# Patient Record
Sex: Female | Born: 1950 | Race: White | Hispanic: No | Marital: Married | Smoking: Never smoker
Health system: Southern US, Academic
[De-identification: ages and names within clinical notes are randomized; demographics above are authoritative.]

## PROBLEM LIST (undated history)

## (undated) DIAGNOSIS — K449 Diaphragmatic hernia without obstruction or gangrene: Secondary | ICD-10-CM

## (undated) DIAGNOSIS — R233 Spontaneous ecchymoses: Secondary | ICD-10-CM

## (undated) DIAGNOSIS — K5904 Chronic idiopathic constipation: Secondary | ICD-10-CM

## (undated) DIAGNOSIS — K2270 Barrett's esophagus without dysplasia: Secondary | ICD-10-CM

## (undated) DIAGNOSIS — N951 Menopausal and female climacteric states: Secondary | ICD-10-CM

## (undated) DIAGNOSIS — J45909 Unspecified asthma, uncomplicated: Secondary | ICD-10-CM

## (undated) DIAGNOSIS — K589 Irritable bowel syndrome without diarrhea: Secondary | ICD-10-CM

## (undated) DIAGNOSIS — C801 Malignant (primary) neoplasm, unspecified: Secondary | ICD-10-CM

## (undated) DIAGNOSIS — K219 Gastro-esophageal reflux disease without esophagitis: Secondary | ICD-10-CM

## (undated) DIAGNOSIS — Z973 Presence of spectacles and contact lenses: Secondary | ICD-10-CM

## (undated) DIAGNOSIS — R609 Edema, unspecified: Secondary | ICD-10-CM

## (undated) DIAGNOSIS — M1990 Unspecified osteoarthritis, unspecified site: Secondary | ICD-10-CM

## (undated) DIAGNOSIS — K5900 Constipation, unspecified: Secondary | ICD-10-CM

## (undated) HISTORY — PX: HX HYSTERECTOMY: SHX81

## (undated) HISTORY — DX: Diaphragmatic hernia without obstruction or gangrene: K44.9

## (undated) HISTORY — PX: HX KNEE REPLACMENT: SHX125

## (undated) HISTORY — PX: HX DILATION AND CURETTAGE: SHX78

## (undated) HISTORY — DX: Menopausal and female climacteric states: N95.1

## (undated) HISTORY — DX: Barrett's esophagus without dysplasia: K22.70

## (undated) HISTORY — PX: HX UPPER ENDOSCOPY: 2100001144

## (undated) HISTORY — PX: FACELIFT: SHX1566

## (undated) HISTORY — DX: Gastro-esophageal reflux disease without esophagitis: K21.9

## (undated) HISTORY — DX: Irritable bowel syndrome, unspecified: K58.9

## (undated) HISTORY — PX: COLONOSCOPY: SHX174

## (undated) HISTORY — PX: HX HIP REPLACEMENT: SHX124

## (undated) HISTORY — DX: Unspecified asthma, uncomplicated: J45.909

## (undated) HISTORY — DX: Edema, unspecified: R60.9

## (undated) HISTORY — DX: Chronic idiopathic constipation: K59.04

---

## 1985-04-09 ENCOUNTER — Other Ambulatory Visit (HOSPITAL_COMMUNITY): Payer: Self-pay

## 2015-12-18 ENCOUNTER — Ambulatory Visit (INDEPENDENT_AMBULATORY_CARE_PROVIDER_SITE_OTHER): Payer: Self-pay | Admitting: Rheumatology

## 2015-12-25 ENCOUNTER — Ambulatory Visit (INDEPENDENT_AMBULATORY_CARE_PROVIDER_SITE_OTHER): Payer: No Typology Code available for payment source | Admitting: Rheumatology

## 2016-02-02 ENCOUNTER — Ambulatory Visit (INDEPENDENT_AMBULATORY_CARE_PROVIDER_SITE_OTHER): Payer: No Typology Code available for payment source | Admitting: Rheumatology

## 2016-04-22 ENCOUNTER — Ambulatory Visit (INDEPENDENT_AMBULATORY_CARE_PROVIDER_SITE_OTHER): Payer: No Typology Code available for payment source | Admitting: Rheumatology

## 2016-04-22 ENCOUNTER — Encounter (INDEPENDENT_AMBULATORY_CARE_PROVIDER_SITE_OTHER): Payer: Self-pay

## 2016-07-03 ENCOUNTER — Ambulatory Visit (INDEPENDENT_AMBULATORY_CARE_PROVIDER_SITE_OTHER): Payer: No Typology Code available for payment source | Admitting: Rheumatology

## 2016-07-03 ENCOUNTER — Ambulatory Visit: Payer: No Typology Code available for payment source | Attending: Rheumatology | Admitting: Rheumatology

## 2016-07-03 VITALS — BP 100/54 | HR 65 | Ht 63.0 in | Wt 106.7 lb

## 2016-07-03 DIAGNOSIS — M19049 Primary osteoarthritis, unspecified hand: Secondary | ICD-10-CM | POA: Insufficient documentation

## 2016-07-03 DIAGNOSIS — E559 Vitamin D deficiency, unspecified: Secondary | ICD-10-CM

## 2016-07-03 DIAGNOSIS — M810 Age-related osteoporosis without current pathological fracture: Secondary | ICD-10-CM | POA: Insufficient documentation

## 2016-07-03 DIAGNOSIS — Z8781 Personal history of (healed) traumatic fracture: Secondary | ICD-10-CM | POA: Insufficient documentation

## 2016-07-03 DIAGNOSIS — K219 Gastro-esophageal reflux disease without esophagitis: Secondary | ICD-10-CM | POA: Insufficient documentation

## 2016-07-03 DIAGNOSIS — R682 Dry mouth, unspecified: Secondary | ICD-10-CM | POA: Insufficient documentation

## 2016-07-03 DIAGNOSIS — H04123 Dry eye syndrome of bilateral lacrimal glands: Secondary | ICD-10-CM | POA: Insufficient documentation

## 2016-07-03 DIAGNOSIS — Z96641 Presence of right artificial hip joint: Secondary | ICD-10-CM | POA: Insufficient documentation

## 2016-07-03 DIAGNOSIS — M8580 Other specified disorders of bone density and structure, unspecified site: Secondary | ICD-10-CM | POA: Insufficient documentation

## 2016-07-03 DIAGNOSIS — Z79899 Other long term (current) drug therapy: Secondary | ICD-10-CM | POA: Insufficient documentation

## 2016-07-03 DIAGNOSIS — K089 Disorder of teeth and supporting structures, unspecified: Secondary | ICD-10-CM | POA: Insufficient documentation

## 2016-07-03 LAB — CREATININE WITH EGFR
CREATININE: 0.79 mg/dL (ref 0.49–1.10)
ESTIMATED GFR: >59 mL/min/1.73m?2 (ref >59)

## 2016-07-03 LAB — CBC WITH DIFF
BASOPHIL #: 0.08 x10?3/uL (ref 0.00–0.20)
BASOPHIL %: 1 %
EOSINOPHIL #: 0.21 x10ˆ3/uL (ref 0.00–0.50)
EOSINOPHIL %: 3 %
HCT: 35.3 % (ref 33.5–45.2)
HGB: 11.3 g/dL (ref 11.2–15.2)
LYMPHOCYTE #: 1.64 x10?3/uL (ref 1.00–4.80)
LYMPHOCYTE %: 27 %
MCH: 26.3 pg — ABNORMAL LOW (ref 27.4–33.0)
MCHC: 32.1 g/dL — ABNORMAL LOW (ref 32.5–35.8)
MCV: 82 fL (ref 78.0–100.0)
MONOCYTE #: 0.63 x10ˆ3/uL (ref 0.30–1.00)
MONOCYTE %: 10 %
MPV: 8.1 fL (ref 7.5–11.5)
NEUTROPHIL #: 3.63 x10ˆ3/uL (ref 1.50–7.70)
NEUTROPHIL %: 59 %
PLATELETS: 371 x10?3/uL (ref 140–450)
RBC: 4.31 x10?6/uL (ref 3.63–4.92)
RDW: 16.3 % — ABNORMAL HIGH (ref 12.0–15.0)
WBC: 6.2 x10ˆ3/uL (ref 3.5–11.0)

## 2016-07-03 LAB — CALCIUM: CALCIUM: 9 mg/dL (ref 8.5–10.2)

## 2016-07-03 LAB — CREATININE: CREATININE: 0.79 mg/dL (ref 0.49–1.10)

## 2016-07-03 LAB — ALK PHOS (ALKALINE PHOSPHATASE): ALKALINE PHOSPHATASE: 71 U/L (ref <150)

## 2016-07-04 LAB — VITAMIN D 25, TOTAL: VITAMIN D, 25OH: 57 ng/mL (ref 30–100)
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
## 2016-07-04 NOTE — Progress Notes (Signed)
Your blood work looks good. No changes are needed at this time.

## 2016-12-03 ENCOUNTER — Encounter (INDEPENDENT_AMBULATORY_CARE_PROVIDER_SITE_OTHER): Payer: No Typology Code available for payment source | Admitting: Rheumatology

## 2017-02-25 ENCOUNTER — Encounter (INDEPENDENT_AMBULATORY_CARE_PROVIDER_SITE_OTHER): Payer: No Typology Code available for payment source | Admitting: Rheumatology

## 2017-09-02 ENCOUNTER — Encounter (INDEPENDENT_AMBULATORY_CARE_PROVIDER_SITE_OTHER): Payer: Self-pay | Admitting: Rheumatology

## 2018-08-28 IMAGING — CR XRAY KNEE 3 VIEWS LT
1 series · 3 of 3 positions shown · non-contrast
Comparison: None available.

EXAM:  XRAY KNEE 3 VIEWS LT
INDICATION: Pain.

[Series 1: view not recorded · 0.17mm/px · 3 of 3 slices shown]
[im 1/3]
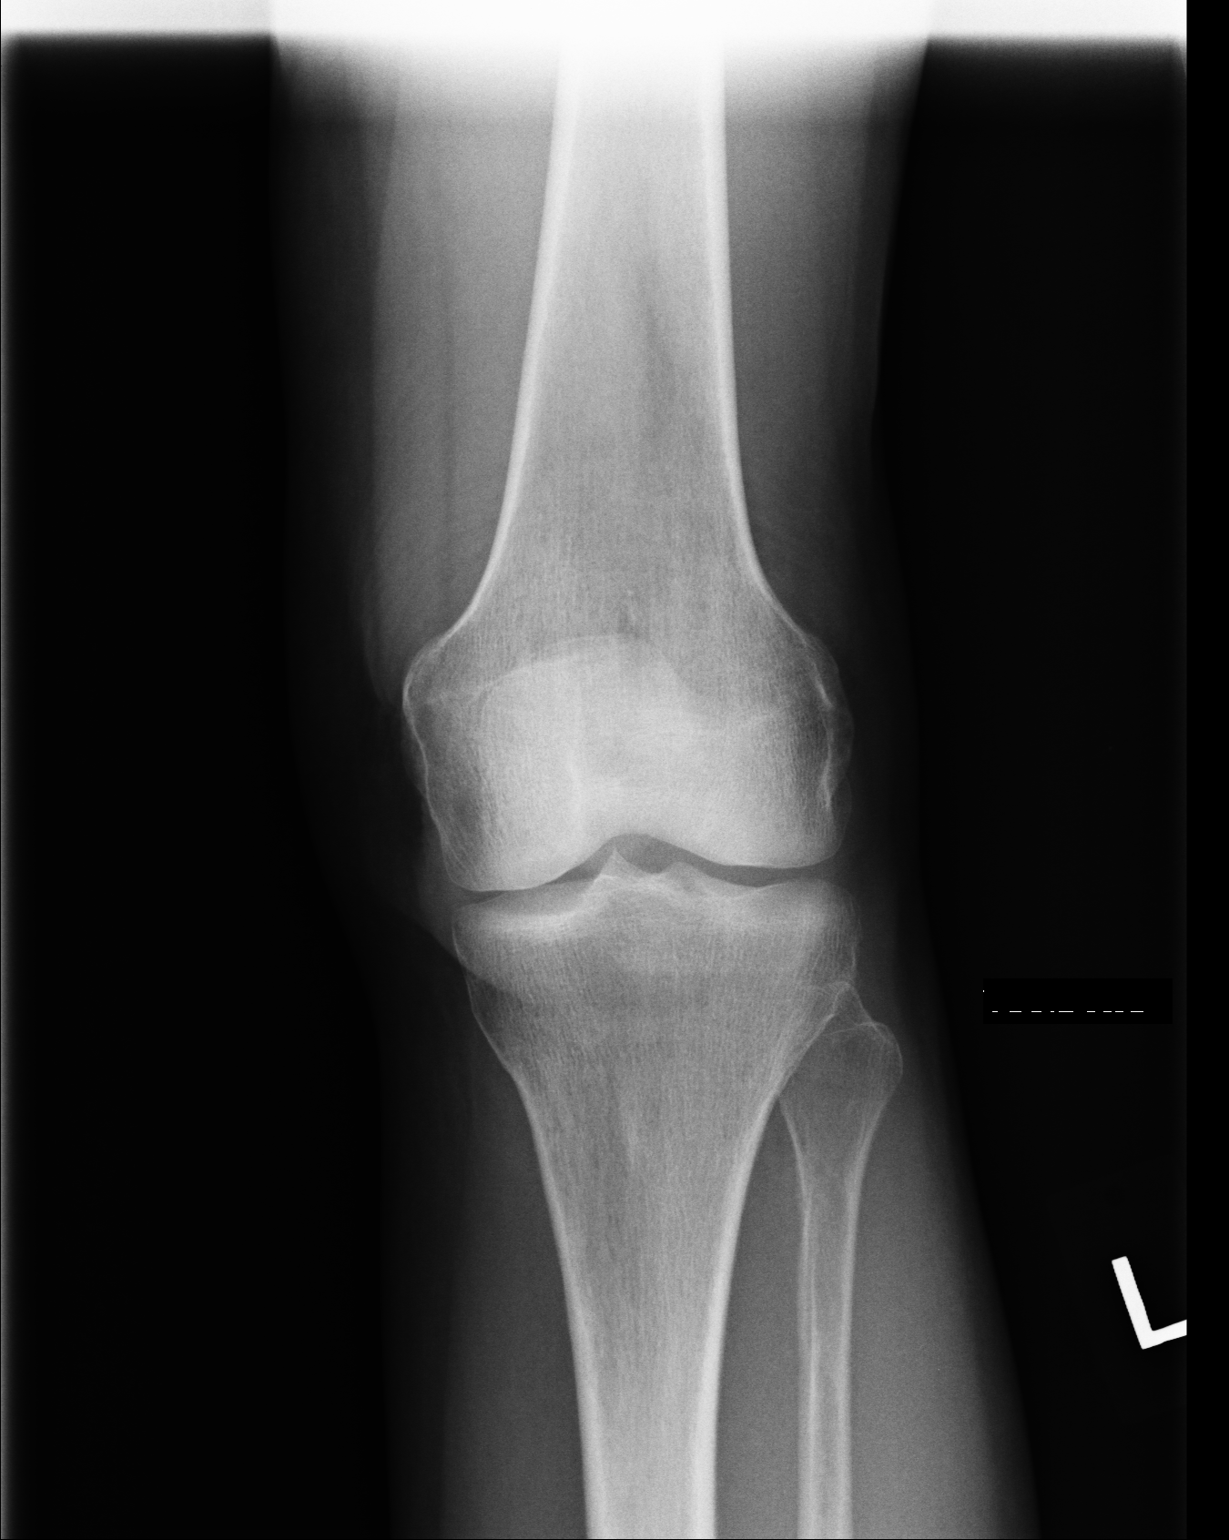
[im 2/3]
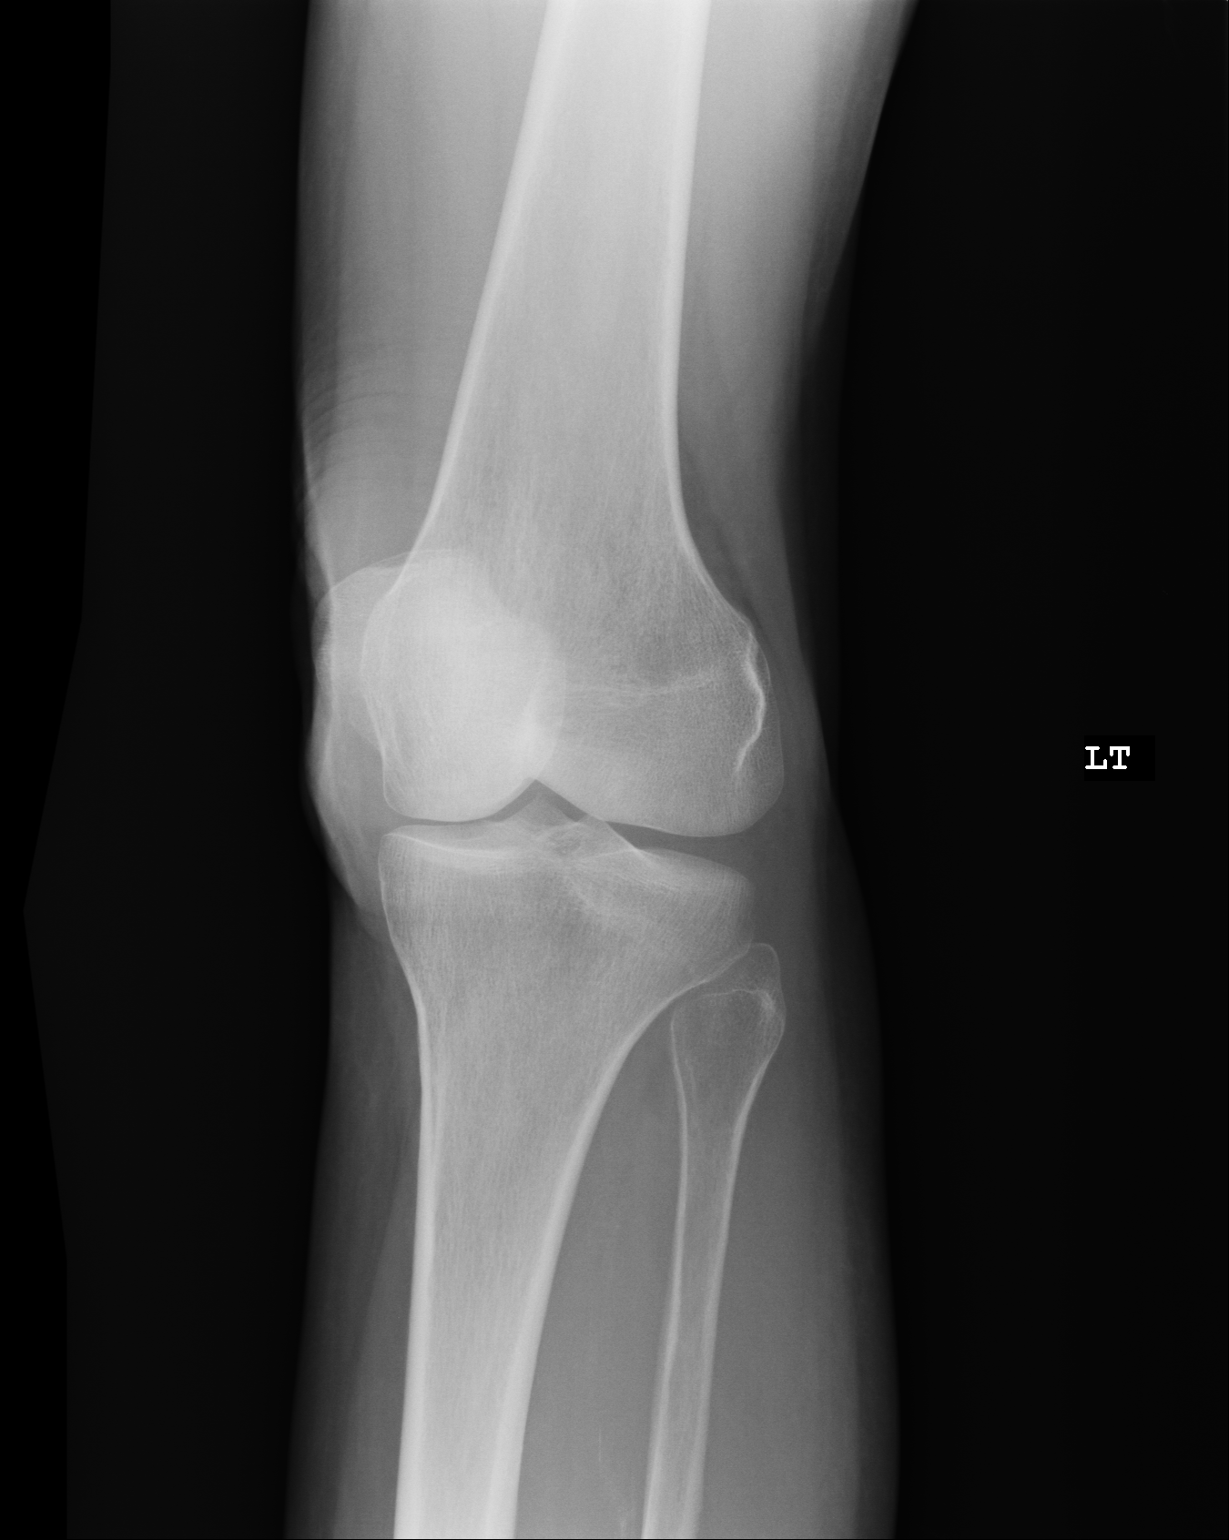
[im 3/3]
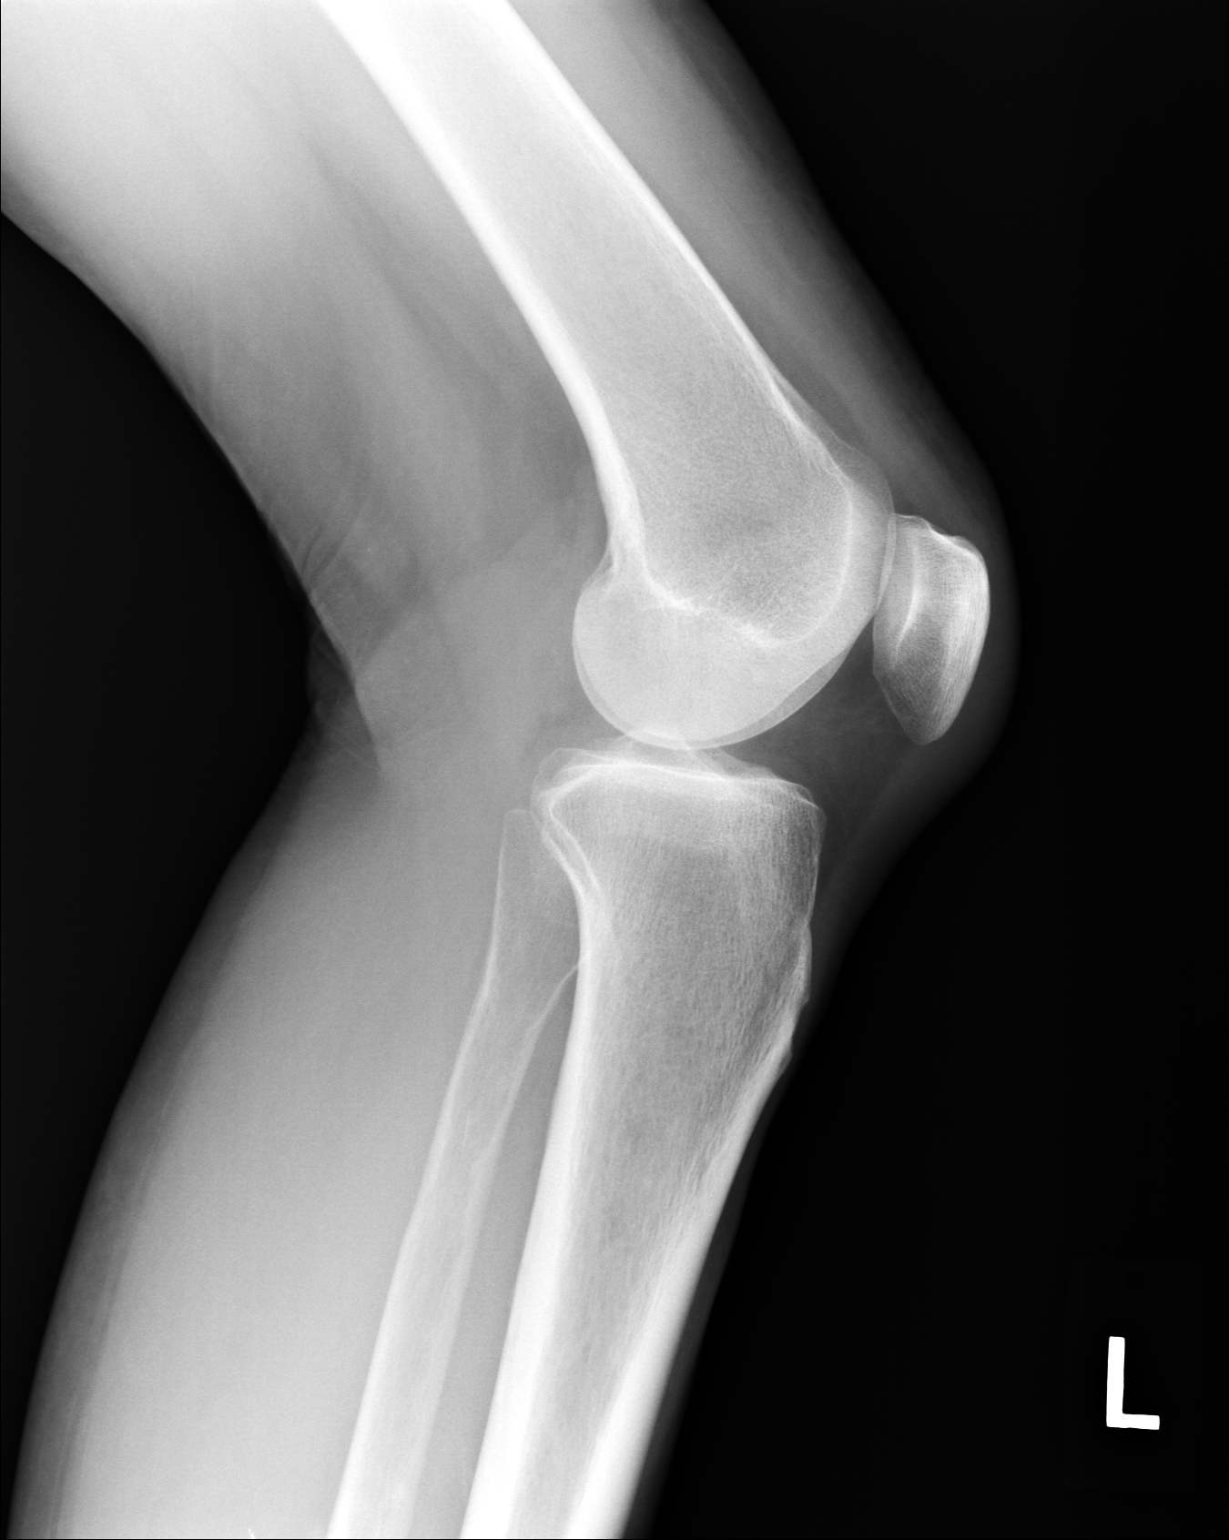

[3 of 3 positions shown; findings below may reference images not displayed]

FINDINGS: There is no acute fracture or subluxation. No significant arthritic changes are seen. There is no suprapatellar effusion. There is no soft tissue abnormality.
IMPRESSION: Unremarkable exam.

## 2020-01-11 IMAGING — MR MRI JOINT LOWER EXTREMITY WITHOUT CONTRAST LT
5 series · 40 of 40 positions shown · IV contrast (gadolinium)
Comparison: Radiographs from outside facility dated 12/01/2019.

﻿EXAM:  MRI JOINT LOWER EXTREMITY WITHOUT CONTRAST LT
INDICATION: Left knee pain and swelling.
TECHNIQUE: Multiplanar multisequential MRI of the left knee joint was performed without gadolinium contrast.

[Series 5: PD fat-sat · axial · left · 4.0mm · 0.53mm/px · z∈[-73,+57]mm · 8 of 30 slices shown (1 of 3)]
[im 1/30]
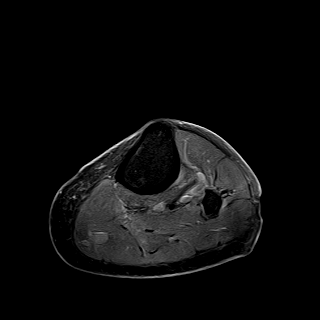
[im 5/30]
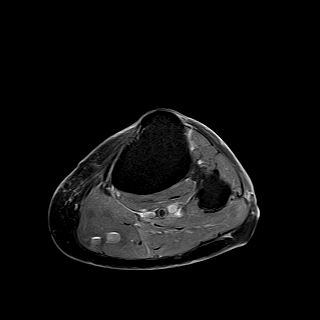
[im 9/30]
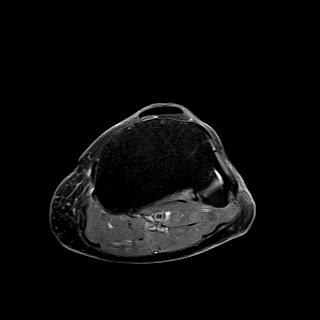
[im 13/30]
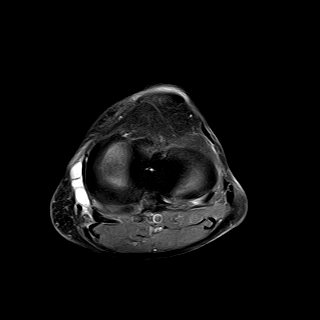
[im 17/30]
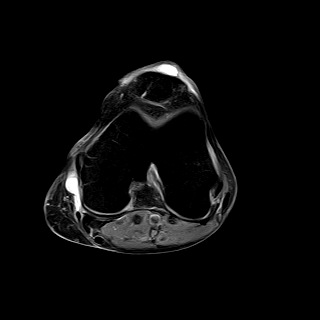
[im 21/30]
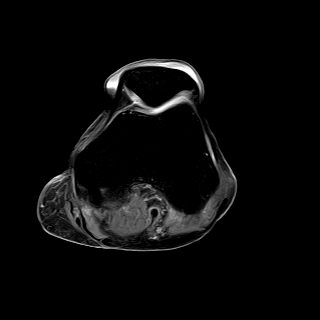
[im 25/30]
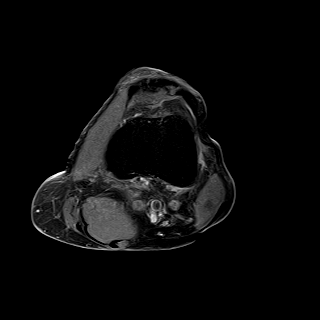
[im 30/30]
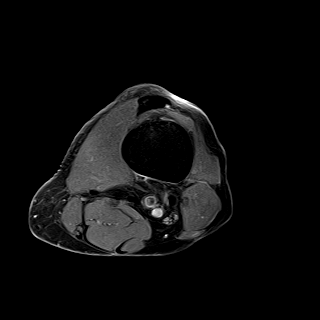

[Series 6: PD fat-sat · sagittal · left · 3.0mm · 0.47mm/px · 8 of 30 slices shown (2 of 3)]
[im 1/30]
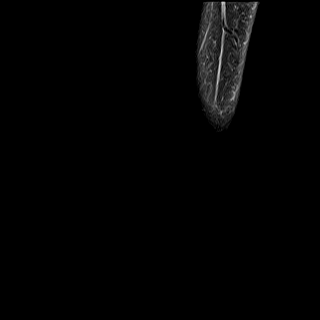
[im 5/30]
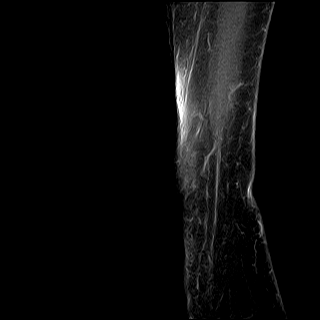
[im 9/30]
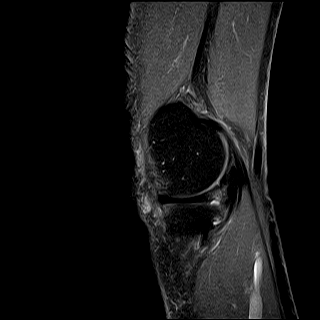
[im 13/30]
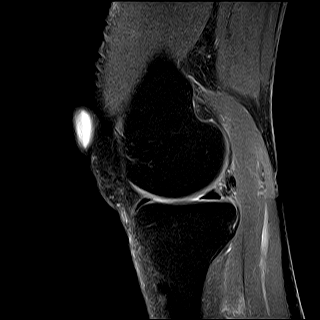
[im 17/30]
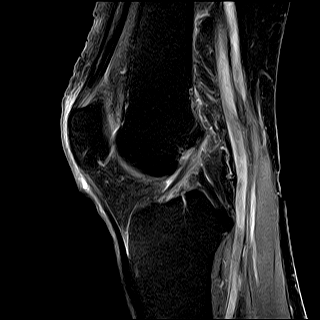
[im 21/30]
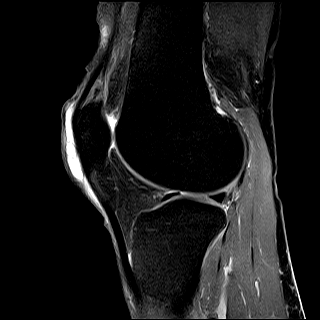
[im 25/30]
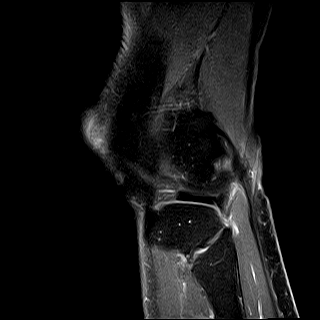
[im 30/30]
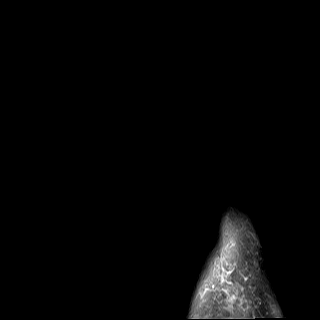

[Series 7: STIR · coronal · left · 3.0mm · 0.47mm/px · 8 of 27 slices shown]
[im 1/27]
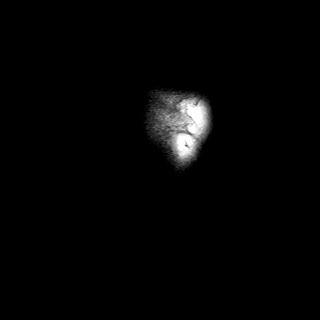
[im 4/27]
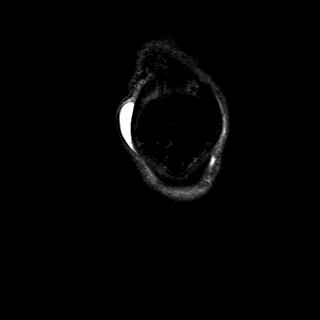
[im 8/27]
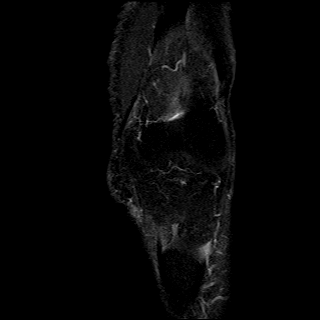
[im 12/27]
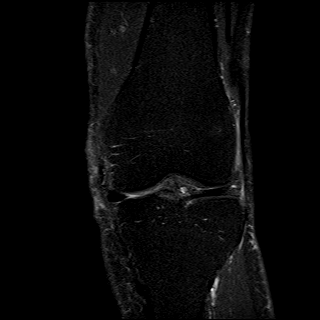
[im 15/27]
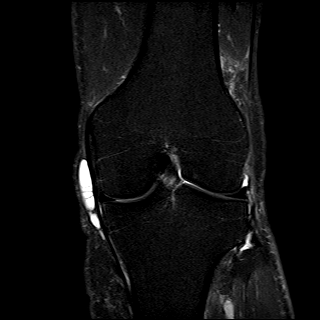
[im 19/27]
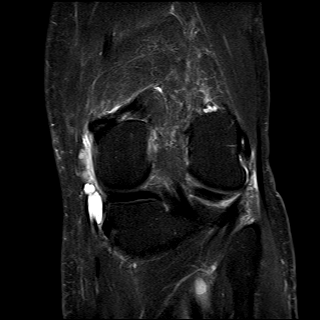
[im 23/27]
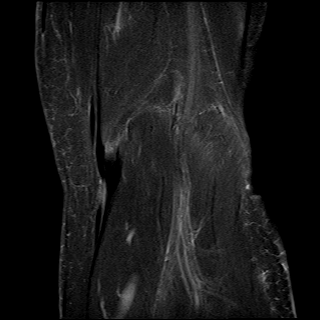
[im 27/27]
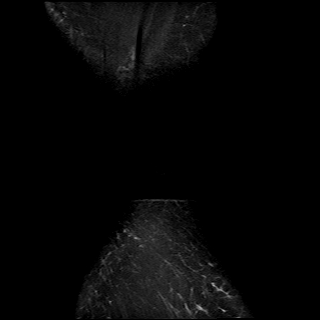

[Series 8: T1 · sagittal · left · 3.0mm · 0.39mm/px · 8 of 30 slices shown]
[im 1/30]
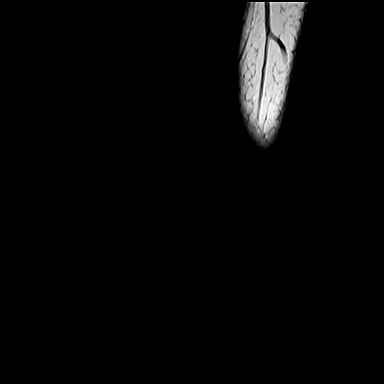
[im 5/30]
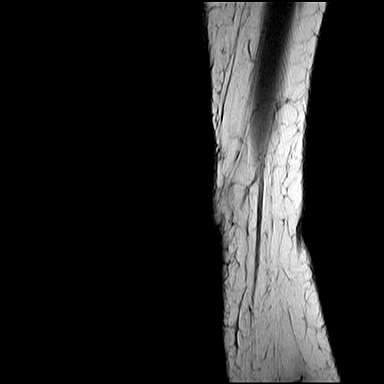
[im 9/30]
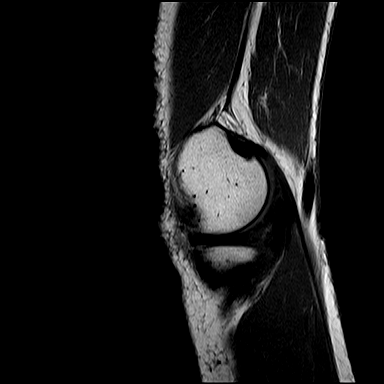
[im 13/30]
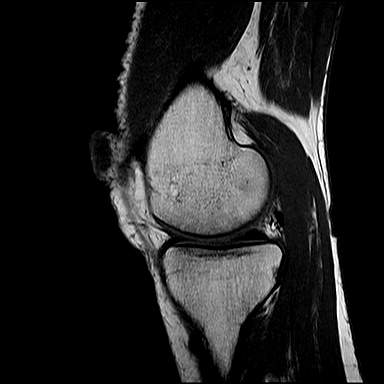
[im 17/30]
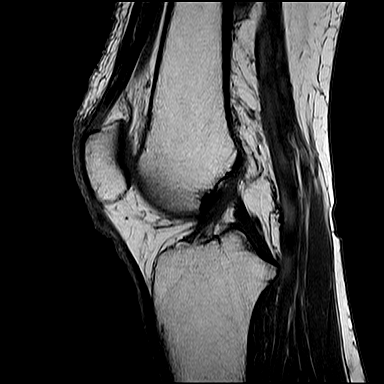
[im 21/30]
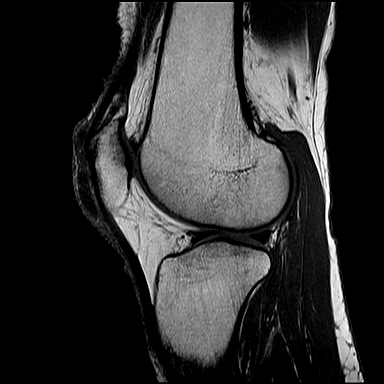
[im 25/30]
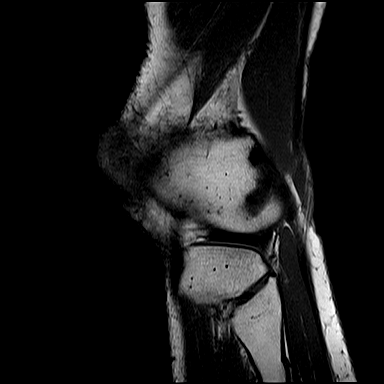
[im 30/30]
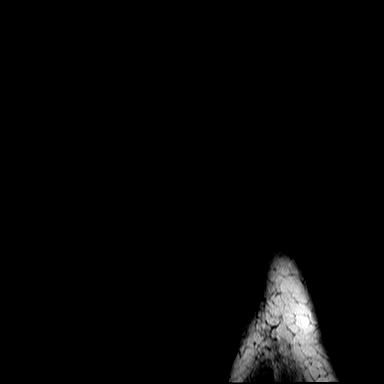

[Series 9: PD fat-sat · coronal · left · 3.0mm · 0.47mm/px · 8 of 27 slices shown (3 of 3)]
[im 1/27]
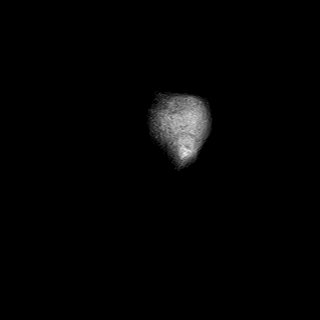
[im 4/27]
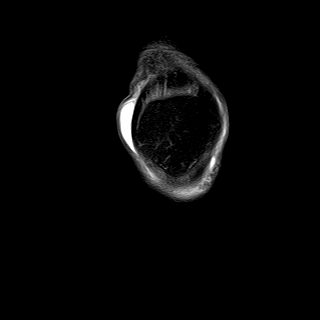
[im 8/27]
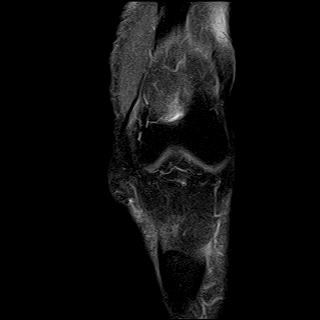
[im 12/27]
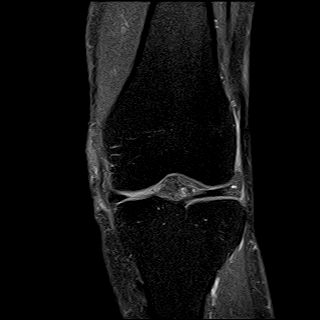
[im 15/27]
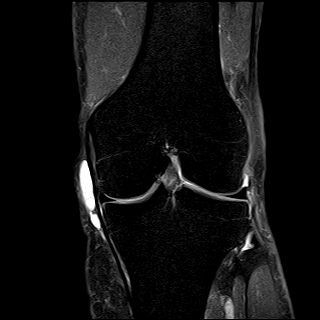
[im 19/27]
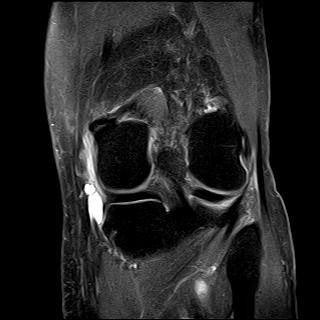
[im 23/27]
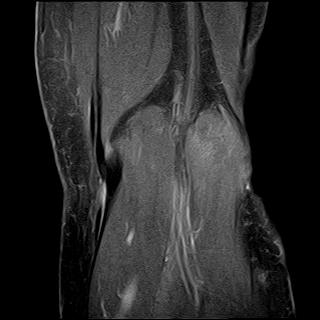
[im 27/27]
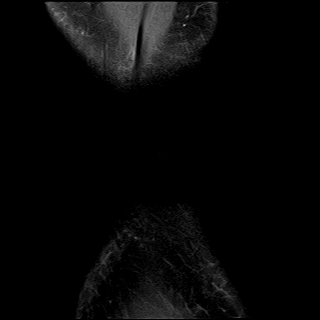

[40 of 40 positions shown; findings below may reference images not displayed]

FINDINGS: Menisci, cruciate and collateral ligaments are intact, within normal limits in morphology and signal intensity. Hyaline cartilage of the tibiofemoral and patellofemoral articulations is also well maintained. There is a 3.8 x 0.8 cm septated cyst along the medial collateral ligament. There is also mild prepatellar bursitis. Extensor mechanism is intact. Capsular attachments appear unremarkable. Bone marrow signal intensity is normal. There is no suprapatellar effusion or Baker's cyst.
IMPRESSION: 1. Intact menisci, cruciate and collateral ligaments. 

2. A 3.8 cm septated cyst along the medial collateral ligament and mild prepatellar bursitis.

## 2020-06-21 IMAGING — MR MRI KNEE RT W/O CONTRAST
5 series · 40 of 40 positions shown · IV contrast (gadolinium)
Comparison: Radiographs from outside facility dated 12/01/2019.

﻿EXAM:  [id number]   MRI KNEE RT W/O CONTRAST
INDICATION: Right knee pain for 3 weeks.
TECHNIQUE: Multiplanar, multisequential MRI of the right knee joint was performed without gadolinium contrast.

[Series 5: PD fat-sat · axial · right · 4.0mm · 0.53mm/px · z∈[-105,+26]mm · 8 of 30 slices shown (1 of 3)]
[im 1/30]
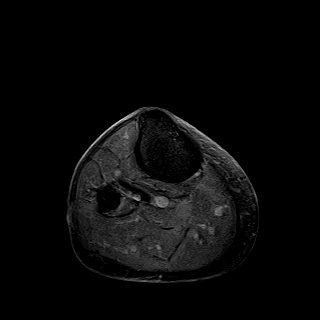
[im 5/30]
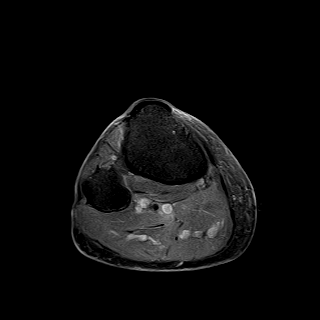
[im 9/30]
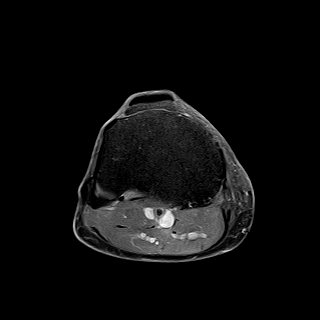
[im 13/30]
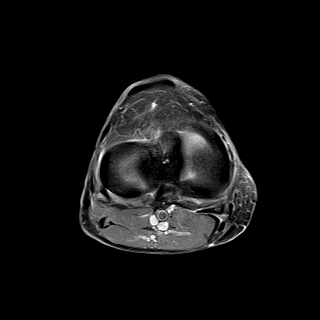
[im 17/30]
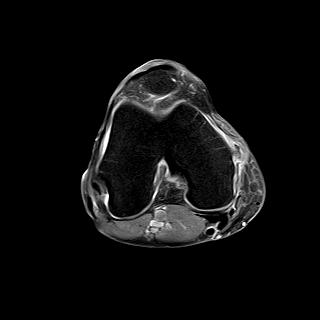
[im 21/30]
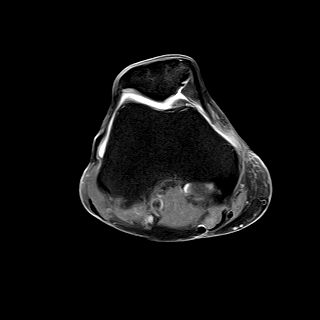
[im 25/30]
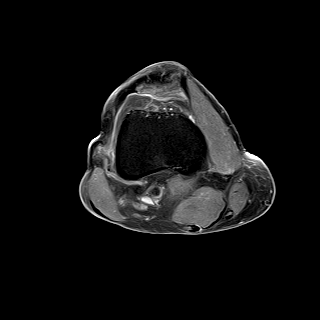
[im 30/30]
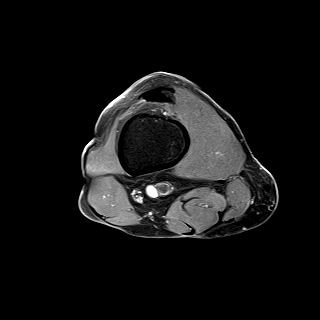

[Series 6: PD fat-sat · sagittal · right · 3.0mm · 0.47mm/px · 9 of 30 slices shown (2 of 3)]
[im 1/30]
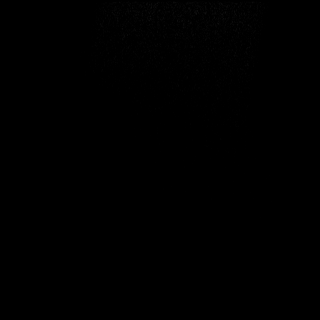
[im 4/30]
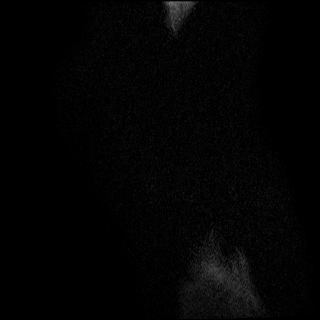
[im 8/30]
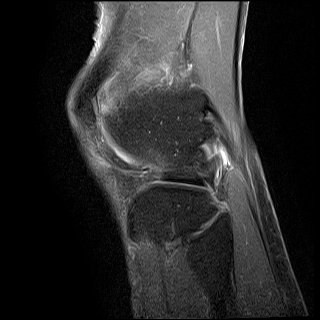
[im 11/30]
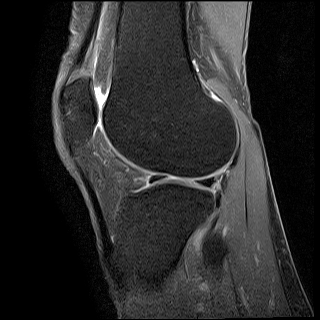
[im 15/30]
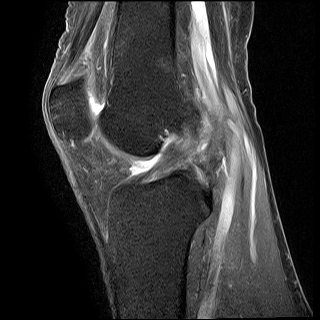
[im 19/30]
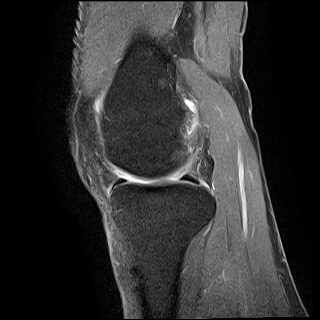
[im 22/30]
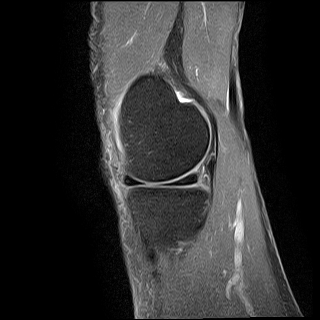
[im 26/30]
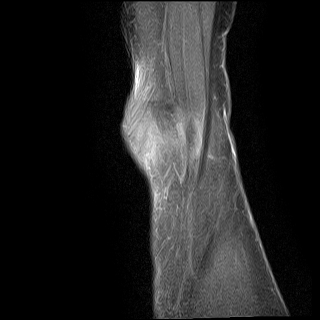
[im 30/30]
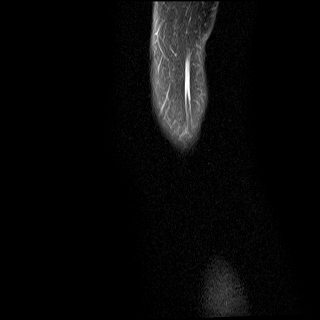

[Series 7: T1 · sagittal · right · 3.0mm · 0.39mm/px · 9 of 30 slices shown]
[im 1/30]
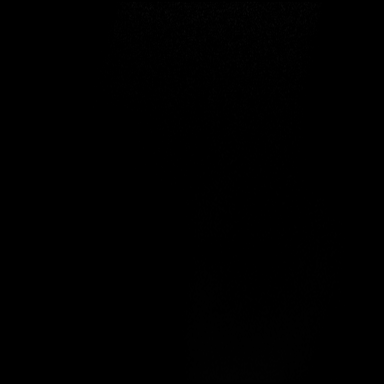
[im 4/30]
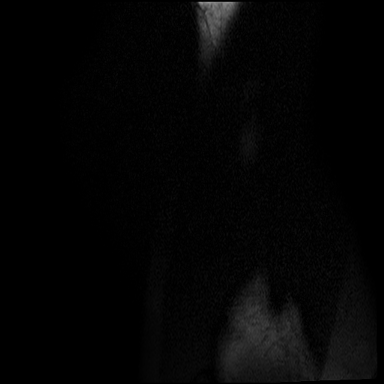
[im 8/30]
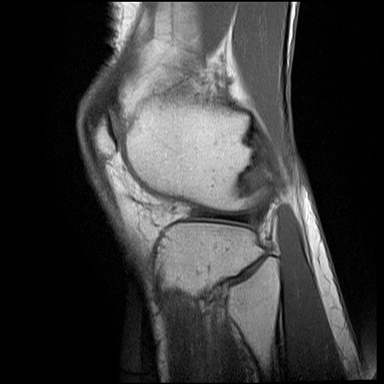
[im 11/30]
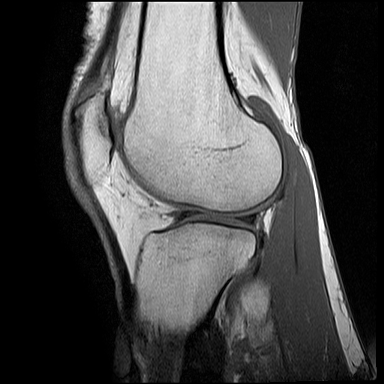
[im 15/30]
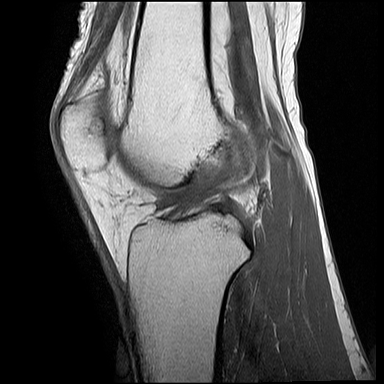
[im 19/30]
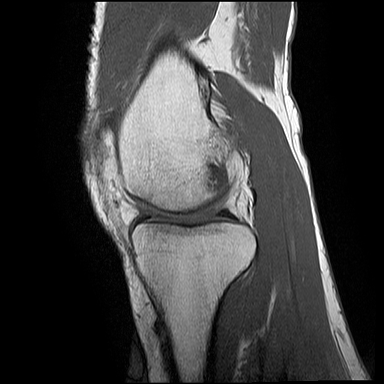
[im 22/30]
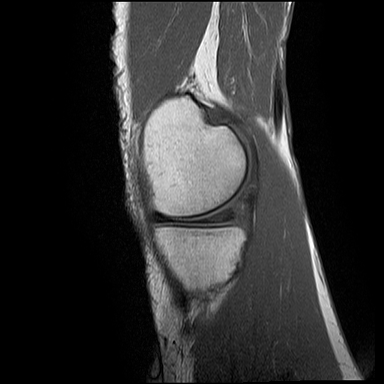
[im 26/30]
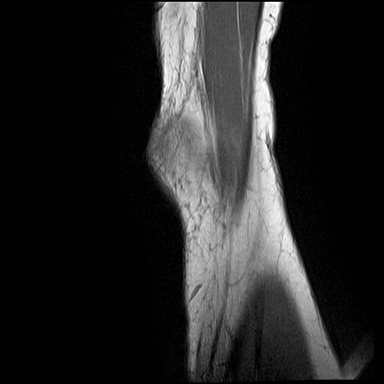
[im 30/30]
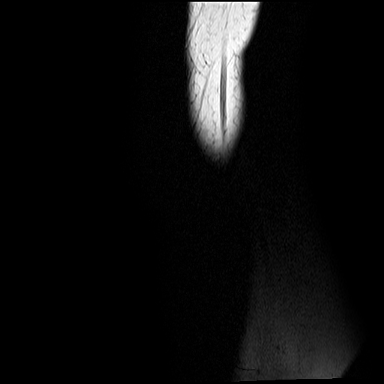

[Series 8: STIR · coronal · right · 3.4mm · 0.47mm/px · 7 of 22 slices shown]
[im 1/22]
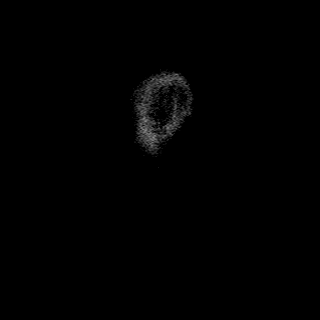
[im 4/22]
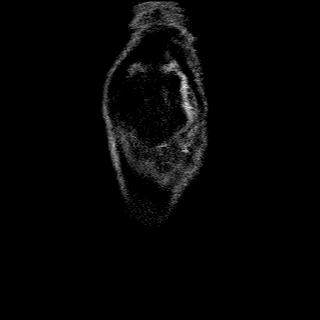
[im 8/22]
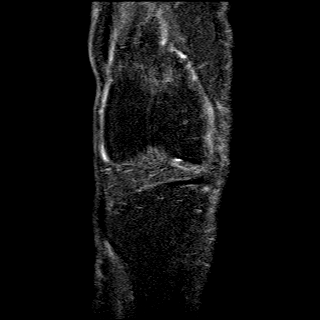
[im 11/22]
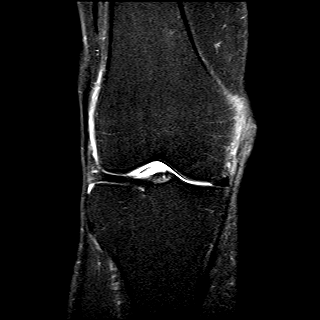
[im 15/22]
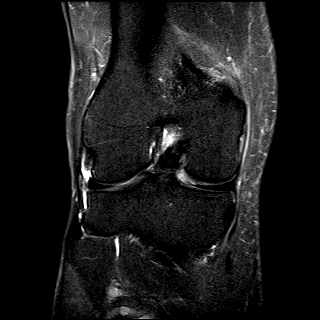
[im 18/22]
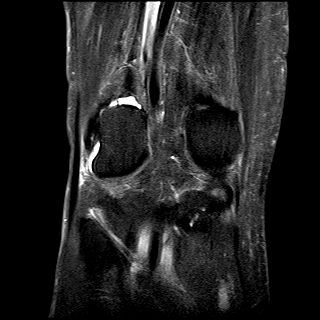
[im 22/22]
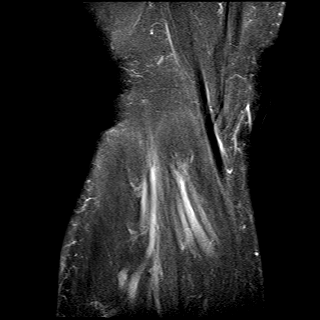

[Series 9: PD fat-sat · coronal · right · 3.4mm · 0.47mm/px · 7 of 22 slices shown (3 of 3)]
[im 1/22]
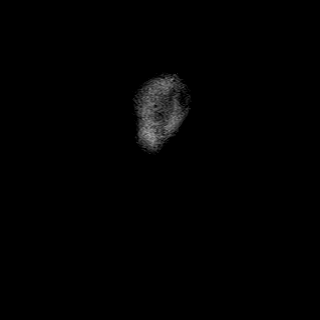
[im 4/22]
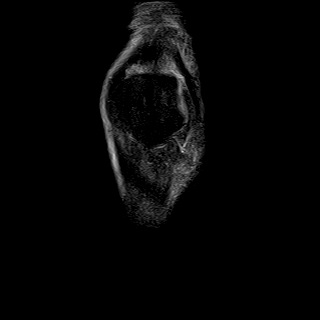
[im 8/22]
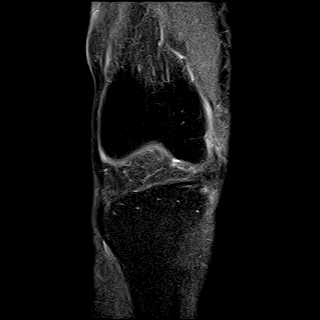
[im 11/22]
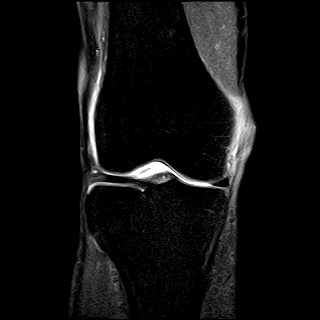
[im 15/22]
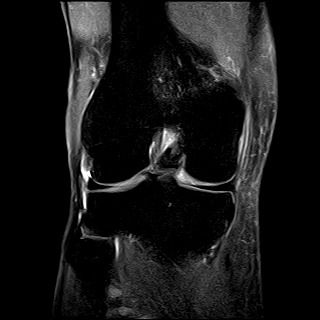
[im 18/22]
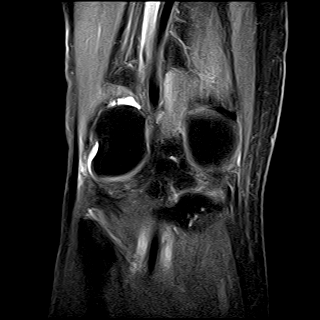
[im 22/22]
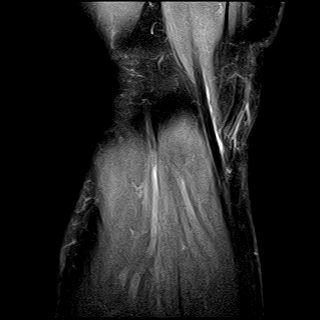

[40 of 40 positions shown; findings below may reference images not displayed]

FINDINGS: There is grade 1 sprain of the proximal medial collateral ligament.  Menisci, cruciate and lateral collateral ligament complex are intact, within normal limits in morphology and signal intensity.  Hyaline cartilage of the tibiofemoral and patellofemoral articulations is well maintained.  Extensor mechanism is intact.  Capsular attachments appear unremarkable.  Bone marrow signal intensity is normal.  There is no suprapatellar effusion or Baker's cyst.
IMPRESSION: 1. Grade 1 sprain of the proximal medial collateral ligament.  

2. Intact menisci, cruciate and lateral collateral ligaments.

## 2020-11-14 IMAGING — MR MRI KNEE LEFT W/O CONTRAST
6 series · 40 of 40 positions shown · IV contrast (gadolinium)
Comparison: Radiographs dated 08/28/2018.

﻿EXAM:  [id number]   MRI KNEE LEFT W/O CONTRAST
INDICATION: Pain.
TECHNIQUE: Multiplanar multisequential MRI of the left knee joint was performed without gadolinium contrast.

[Series 4: s-map · axial · right · 7.8mm · 3.91mm/px · z∈[-152,+152]mm · 14 of 79 slices shown]
[im 1/79]
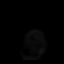
[im 7/79]
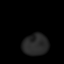
[im 13/79]
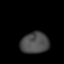
[im 19/79]
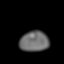
[im 25/79]
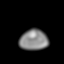
[im 31/79]
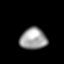
[im 37/79]
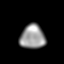
[im 43/79]
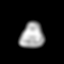
[im 49/79]
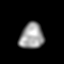
[im 55/79]
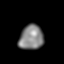
[im 61/79]
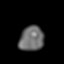
[im 67/79]
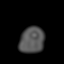
[im 73/79]
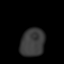
[im 79/79]
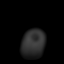

[Series 5: PD fat-sat · axial · left · 4.0mm · 0.37mm/px · z∈[-76,+55]mm · 6 of 30 slices shown (1 of 3)]
[im 1/30]
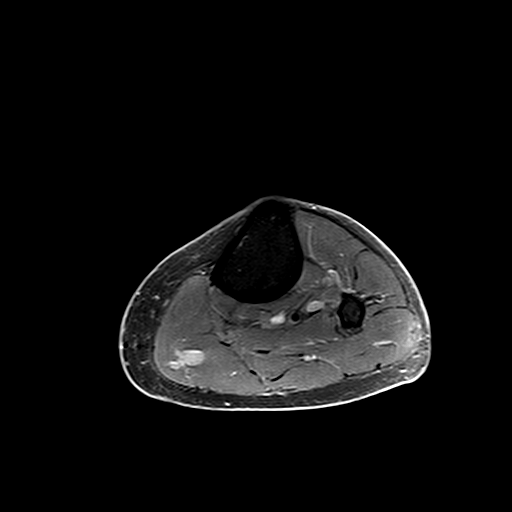
[im 6/30]
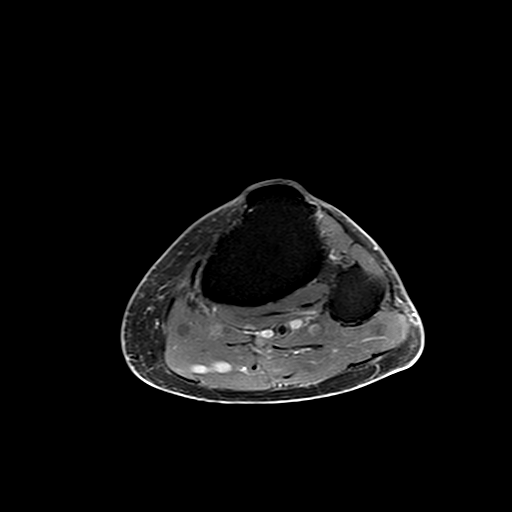
[im 12/30]
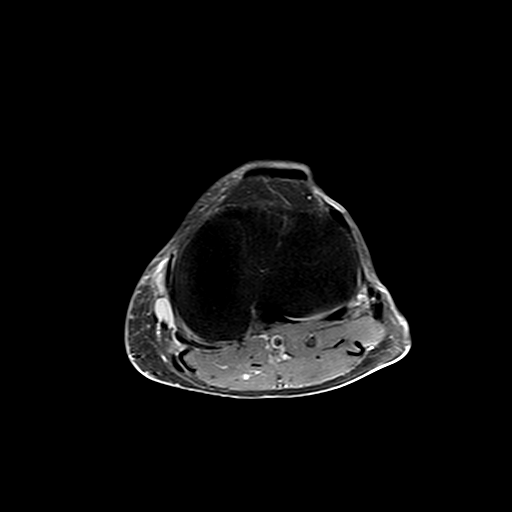
[im 18/30]
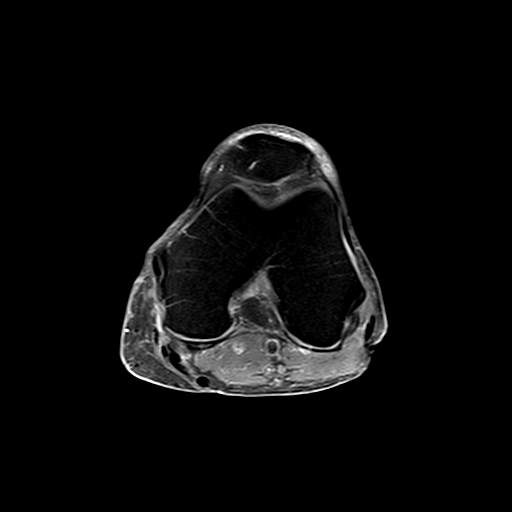
[im 24/30]
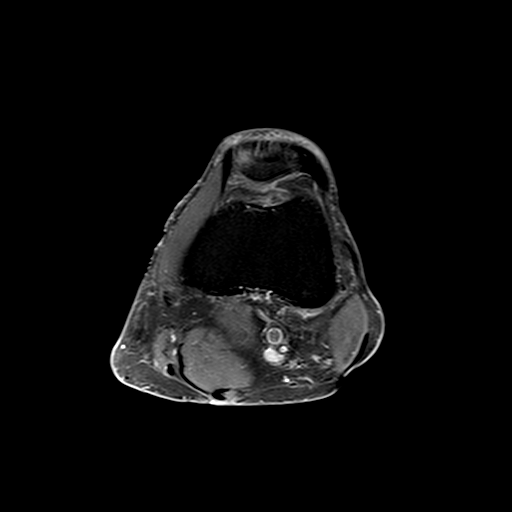
[im 30/30]
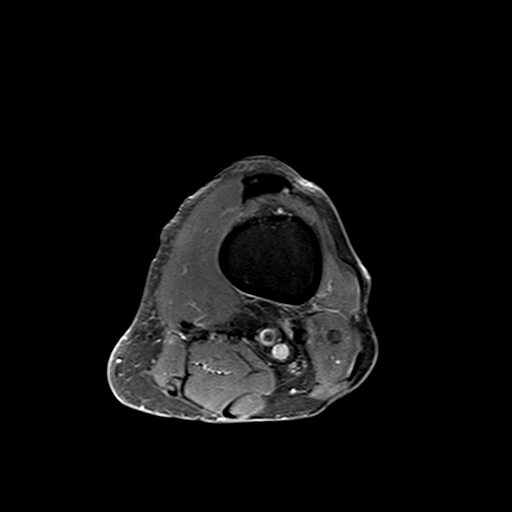

[Series 6: PD fat-sat · sagittal · right · 3.0mm · 0.47mm/px · 6 of 30 slices shown (2 of 3)]
[im 1/30]
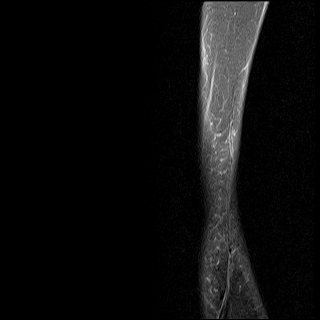
[im 6/30]
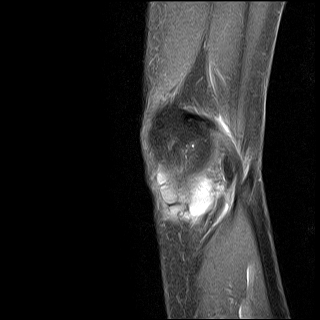
[im 12/30]
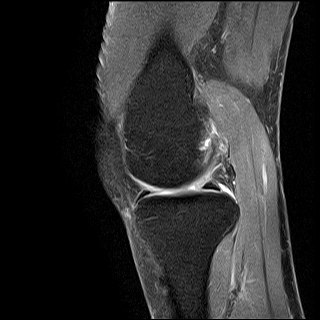
[im 18/30]
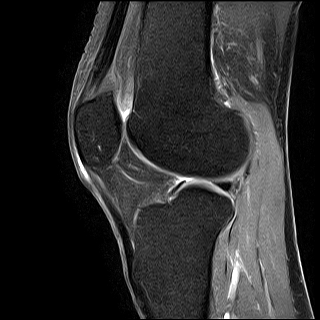
[im 24/30]
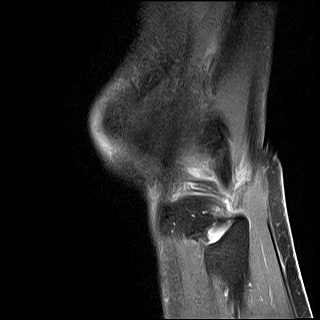
[im 30/30]
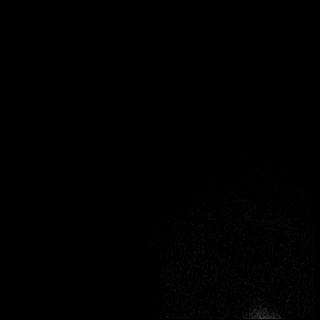

[Series 7: T1 · sagittal · right · 3.0mm · 0.39mm/px · 6 of 30 slices shown]
[im 1/30]
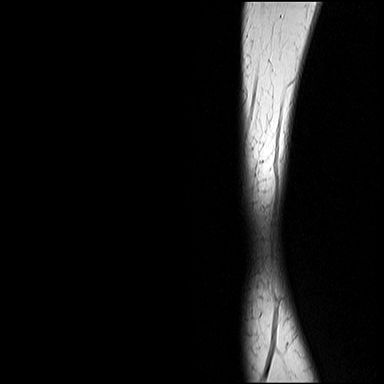
[im 6/30]
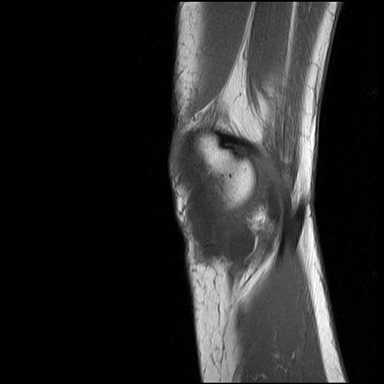
[im 12/30]
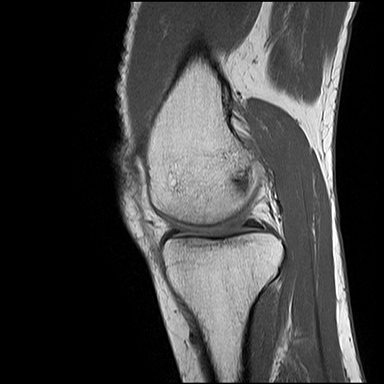
[im 18/30]
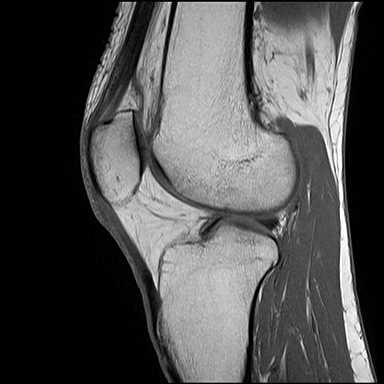
[im 24/30]
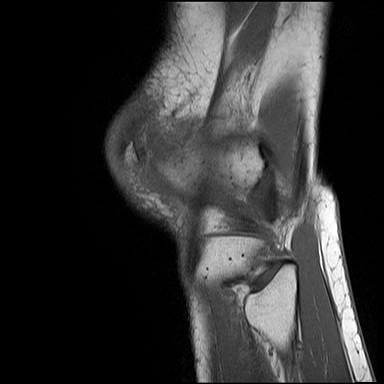
[im 30/30]
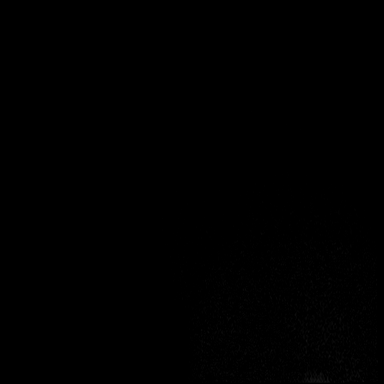

[Series 8: STIR · coronal · right · 3.0mm · 0.47mm/px · 4 of 22 slices shown]
[im 1/22]
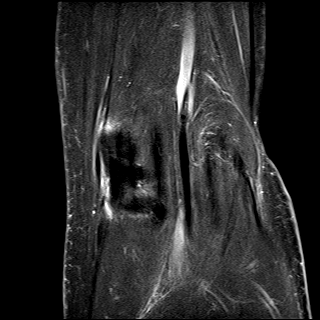
[im 8/22]
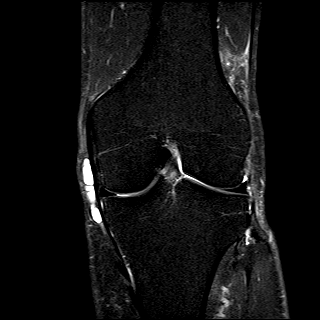
[im 15/22]
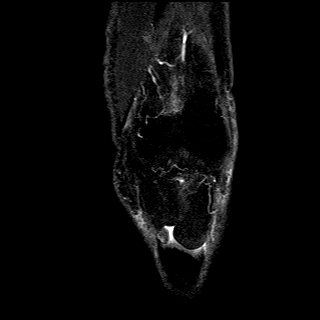
[im 22/22]
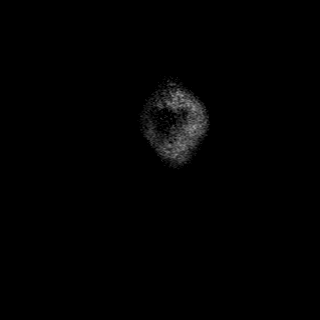

[Series 9: PD fat-sat · coronal · right · 3.0mm · 0.47mm/px · 4 of 22 slices shown (3 of 3)]
[im 1/22]
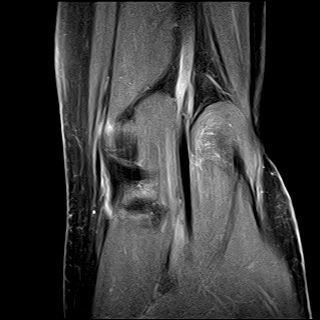
[im 8/22]
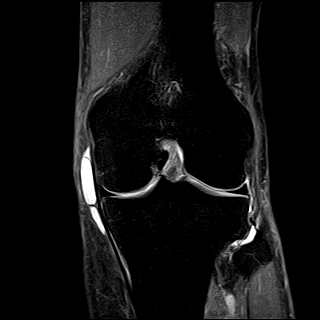
[im 15/22]
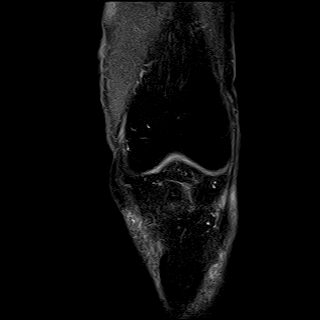
[im 22/22]
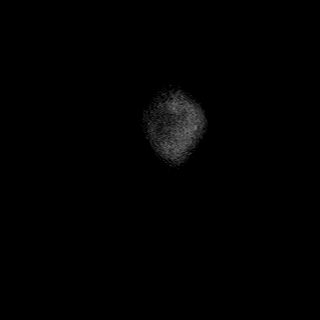

[40 of 40 positions shown; findings below may reference images not displayed]

FINDINGS: Menisci, cruciate and collateral ligaments are intact, within normal limits in morphology and signal intensity. Hyaline cartilage of the tibiofemoral and patellofemoral articulations is also well maintained. There is a 4 cm septated cyst superficial to the medial collateral ligament. Extensor mechanism is intact. Capsular attachments appear unremarkable. Bone marrow signal intensity is normal. There is no suprapatellar effusion or Baker's cyst.
IMPRESSION: 1. Intact menisci, cruciate and collateral ligaments. 

2. A 4 cm septated cyst superficial to the medial collateral ligament.

## 2021-02-01 IMAGING — MR MRI KNEE RT W/O CONTRAST
6 series · 40 of 40 positions shown · IV contrast (gadolinium)
Comparison: Radiographs dated 01/24/2021 and MRI dated 06/21/2020.

﻿EXAM:  [id number]   MRI KNEE RT W/O CONTRAST
INDICATION: Pain after running.
TECHNIQUE: Multiplanar multisequential MRI of the right knee joint was performed without gadolinium contrast.

[Series 4: s-map · axial · right · 7.8mm · 3.91mm/px · z∈[-152,+152]mm · 15 of 80 slices shown]
[im 1/80]
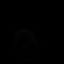
[im 6/80]
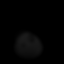
[im 12/80]
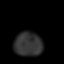
[im 17/80]
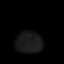
[im 23/80]
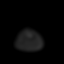
[im 29/80]
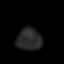
[im 34/80]
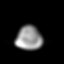
[im 40/80]
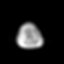
[im 46/80]
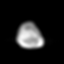
[im 51/80]
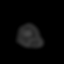
[im 57/80]
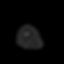
[im 63/80]
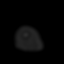
[im 68/80]
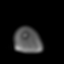
[im 74/80]
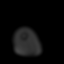
[im 80/80]
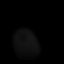

[Series 5: PD fat-sat · axial · right · 4.0mm · 0.53mm/px · z∈[-72,+58]mm · 5 of 30 slices shown (1 of 3)]
[im 1/30]
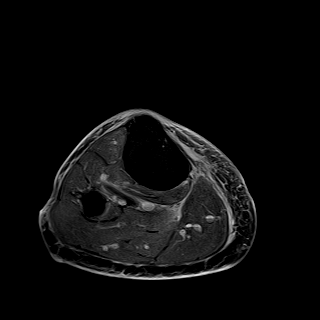
[im 8/30]
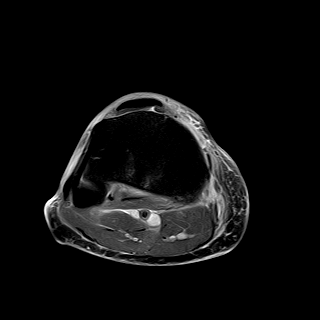
[im 15/30]
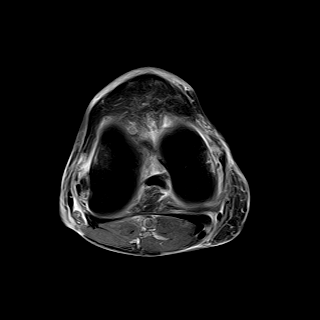
[im 22/30]
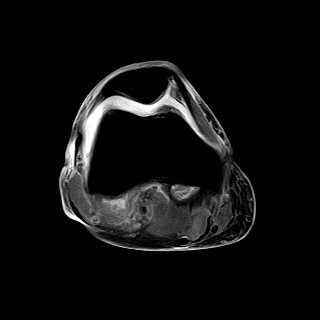
[im 30/30]
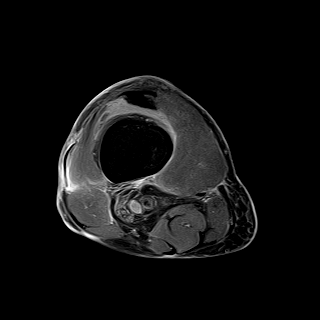

[Series 6: PD fat-sat · sagittal · right · 3.0mm · 0.29mm/px · 5 of 30 slices shown (2 of 3)]
[im 1/30]
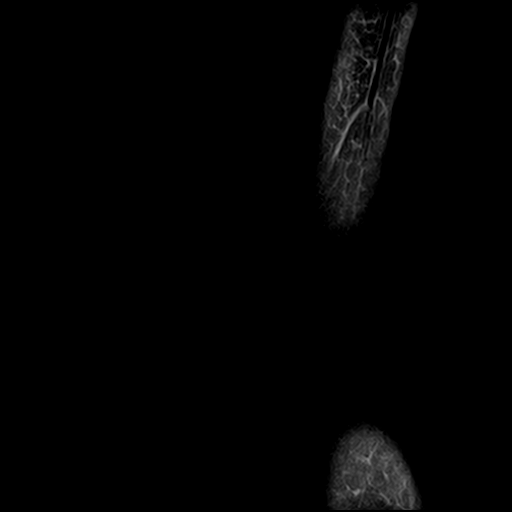
[im 8/30]
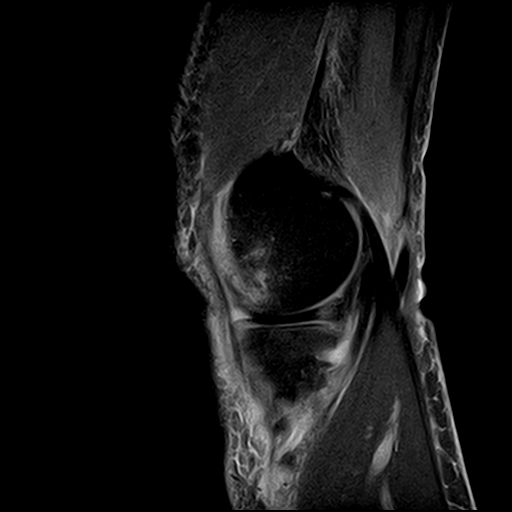
[im 15/30]
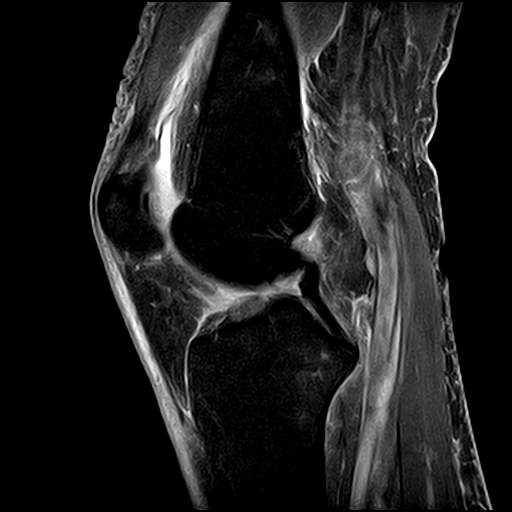
[im 22/30]
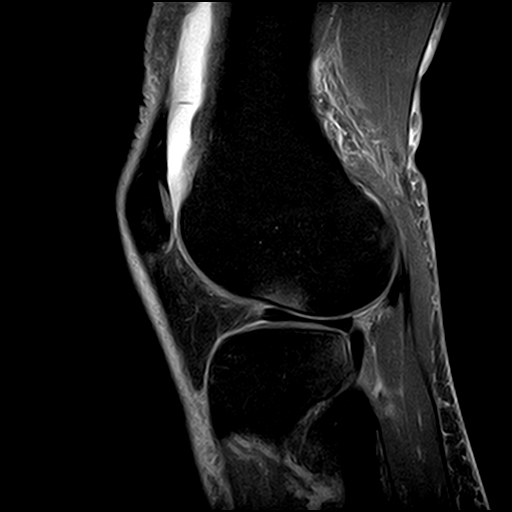
[im 30/30]
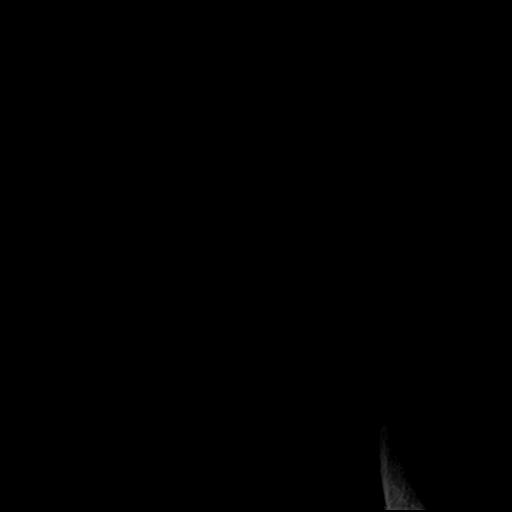

[Series 7: T1 · sagittal · right · 3.0mm · 0.39mm/px · 5 of 30 slices shown]
[im 1/30]
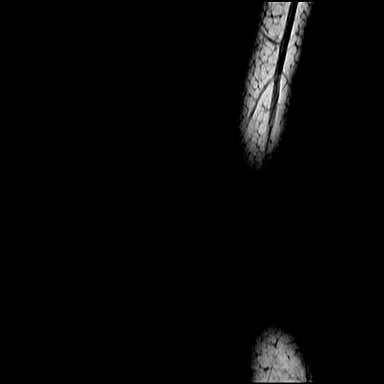
[im 8/30]
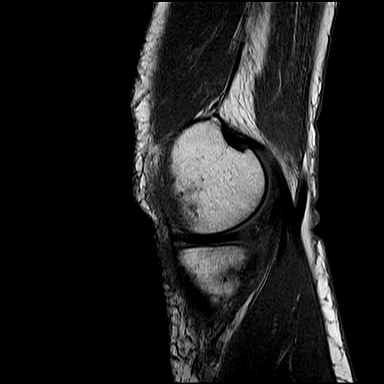
[im 15/30]
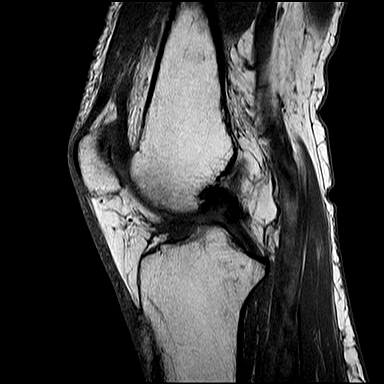
[im 22/30]
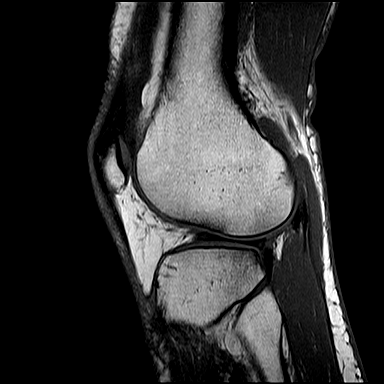
[im 30/30]
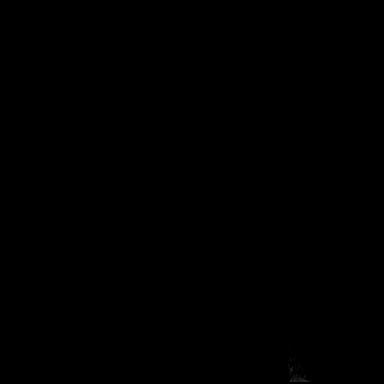

[Series 8: STIR · coronal · right · 3.0mm · 0.47mm/px · 5 of 27 slices shown]
[im 1/27]
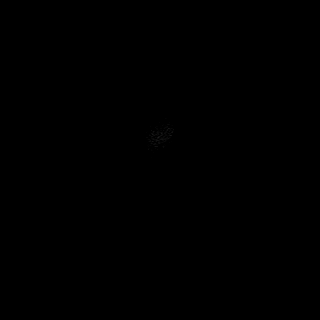
[im 7/27]
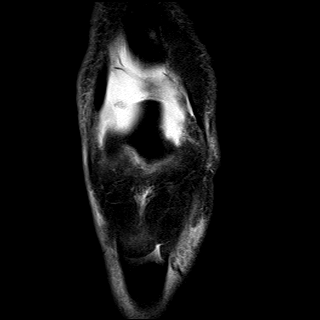
[im 14/27]
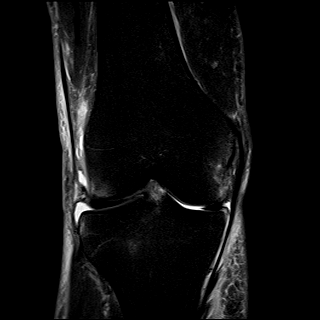
[im 20/27]
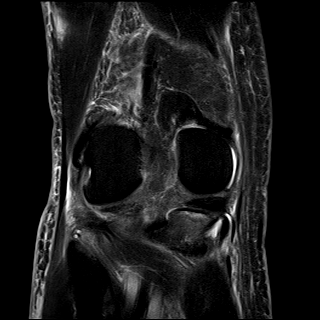
[im 27/27]
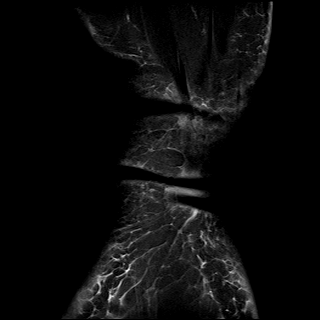

[Series 9: PD fat-sat · coronal · right · 3.0mm · 0.47mm/px · 5 of 27 slices shown (3 of 3)]
[im 1/27]
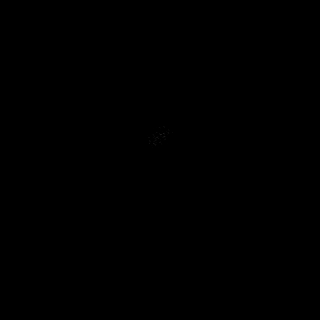
[im 7/27]
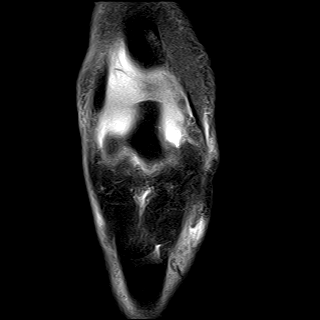
[im 14/27]
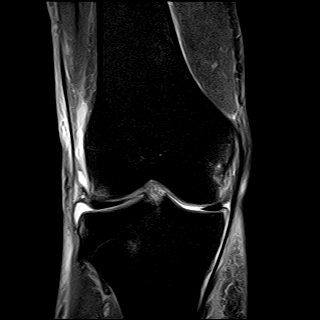
[im 20/27]
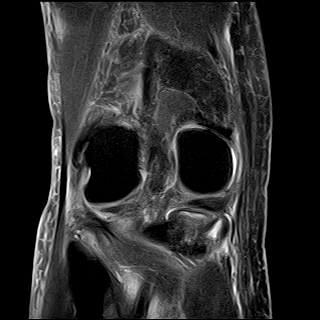
[im 27/27]
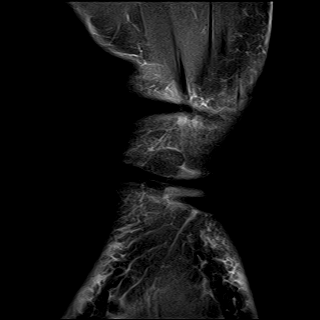

[40 of 40 positions shown; findings below may reference images not displayed]

FINDINGS: There is complete midsubstance anterior cruciate ligament tear. There is also grade 2 sprain of the medial collateral ligament. Menisci, posterior cruciate and lateral collateral ligament complex are intact, within normal limits in morphology and signal intensity. There is grade 2 chondromalacia of the medial tibiofemoral and patellofemoral articulations. Extensor mechanism is intact. Capsular attachments appear unremarkable. Mild patchy edema is seen within the posterior tibial plateau. There is a moderate suprapatellar effusion. There is no Baker's cyst.
IMPRESSION: 1. Complete midsubstance anterior cruciate ligament tear. 

2. Mild patchy edema within the posterior tibial plateau. 

3. Grade 2 sprain of the medial collateral ligament. 

4. Moderate suprapatellar effusion.

## 2021-05-03 ENCOUNTER — Encounter (RURAL_HEALTH_CENTER): Payer: Self-pay | Admitting: Internal Medicine

## 2021-05-31 ENCOUNTER — Encounter (RURAL_HEALTH_CENTER): Payer: Self-pay | Admitting: Internal Medicine

## 2021-05-31 ENCOUNTER — Other Ambulatory Visit: Payer: Self-pay

## 2021-05-31 ENCOUNTER — Ambulatory Visit (RURAL_HEALTH_CENTER): Payer: Medicare Other | Attending: Internal Medicine | Admitting: Internal Medicine

## 2021-05-31 ENCOUNTER — Other Ambulatory Visit: Payer: Medicare Other | Attending: Internal Medicine | Admitting: Internal Medicine

## 2021-05-31 DIAGNOSIS — K5904 Chronic idiopathic constipation: Secondary | ICD-10-CM | POA: Insufficient documentation

## 2021-05-31 DIAGNOSIS — K2270 Barrett's esophagus without dysplasia: Secondary | ICD-10-CM | POA: Insufficient documentation

## 2021-05-31 DIAGNOSIS — Z01812 Encounter for preprocedural laboratory examination: Secondary | ICD-10-CM | POA: Insufficient documentation

## 2021-05-31 DIAGNOSIS — Z01810 Encounter for preprocedural cardiovascular examination: Secondary | ICD-10-CM | POA: Insufficient documentation

## 2021-05-31 DIAGNOSIS — K449 Diaphragmatic hernia without obstruction or gangrene: Secondary | ICD-10-CM | POA: Insufficient documentation

## 2021-05-31 DIAGNOSIS — R0989 Other specified symptoms and signs involving the circulatory and respiratory systems: Secondary | ICD-10-CM | POA: Insufficient documentation

## 2021-05-31 DIAGNOSIS — N951 Menopausal and female climacteric states: Secondary | ICD-10-CM | POA: Insufficient documentation

## 2021-05-31 DIAGNOSIS — K219 Gastro-esophageal reflux disease without esophagitis: Secondary | ICD-10-CM | POA: Insufficient documentation

## 2021-05-31 DIAGNOSIS — Z01818 Encounter for other preprocedural examination: Secondary | ICD-10-CM | POA: Insufficient documentation

## 2021-05-31 DIAGNOSIS — K589 Irritable bowel syndrome without diarrhea: Secondary | ICD-10-CM | POA: Insufficient documentation

## 2021-05-31 DIAGNOSIS — E559 Vitamin D deficiency, unspecified: Secondary | ICD-10-CM

## 2021-05-31 DIAGNOSIS — K581 Irritable bowel syndrome with constipation: Secondary | ICD-10-CM | POA: Insufficient documentation

## 2021-05-31 DIAGNOSIS — J45909 Unspecified asthma, uncomplicated: Secondary | ICD-10-CM | POA: Insufficient documentation

## 2021-05-31 DIAGNOSIS — R609 Edema, unspecified: Secondary | ICD-10-CM | POA: Insufficient documentation

## 2021-05-31 LAB — CBC WITH DIFF
BASOPHIL #: 0 10*3/uL (ref 0.00–2.50)
BASOPHIL %: 1 % (ref 0–3)
EOSINOPHIL #: 0.2 10*3/uL (ref 0.00–2.40)
EOSINOPHIL %: 4 % (ref 0–7)
HCT: 40.3 % (ref 37.0–47.0)
HGB: 13.7 g/dL (ref 12.5–16.0)
LYMPHOCYTE #: 1.6 10*3/uL — ABNORMAL LOW (ref 2.10–11.00)
LYMPHOCYTE %: 27 % (ref 25–45)
MCH: 31.7 pg (ref 27.0–32.0)
MCHC: 34.1 g/dL (ref 32.0–36.0)
MCV: 92.9 fL (ref 78.0–99.0)
MONOCYTE #: 0.5 10*3/uL (ref 0.00–4.10)
MONOCYTE %: 9 % (ref 0–12)
MPV: 9.7 fL (ref 7.4–10.4)
NEUTROPHIL #: 3.7 10*3/uL — ABNORMAL LOW (ref 4.10–29.00)
NEUTROPHIL %: 61 % (ref 40–76)
PLATELETS: 310 10*3/uL (ref 140–440)
RBC: 4.34 10*6/uL (ref 4.20–5.40)
RDW: 13.2 % (ref 11.6–14.8)
WBC: 6 10*3/uL (ref 4.0–10.5)
WBCS UNCORRECTED: 6 10*3/uL

## 2021-05-31 LAB — COMPREHENSIVE METABOLIC PANEL, NON-FASTING
ALBUMIN/GLOBULIN RATIO: 1.6 — ABNORMAL HIGH (ref 0.8–1.4)
ALBUMIN: 4.1 g/dL (ref 3.5–5.7)
ALKALINE PHOSPHATASE: 51 U/L (ref 34–104)
ALT (SGPT): 15 U/L (ref 7–52)
ANION GAP: 5 mmol/L — ABNORMAL LOW (ref 10–20)
AST (SGOT): 23 U/L (ref 13–39)
BILIRUBIN TOTAL: 0.3 mg/dL (ref 0.3–1.2)
BUN/CREA RATIO: 20 (ref 6–22)
BUN: 17 mg/dL (ref 7–25)
CALCIUM, CORRECTED: 8.7 mg/dL — ABNORMAL LOW (ref 8.9–10.8)
CALCIUM: 8.8 mg/dL (ref 8.6–10.3)
CHLORIDE: 107 mmol/L (ref 98–107)
CO2 TOTAL: 29 mmol/L (ref 21–31)
CREATININE: 0.85 mg/dL (ref 0.60–1.30)
ESTIMATED GFR: 74 mL/min/{1.73_m2} (ref >59)
GLOBULIN: 2.6 — ABNORMAL LOW (ref 2.9–5.4)
GLUCOSE: 88 mg/dL (ref 74–109)
OSMOLALITY, CALCULATED: 282 mOsm/kg (ref 270–290)
POTASSIUM: 4.2 mmol/L (ref 3.5–5.1)
PROTEIN TOTAL: 6.7 g/dL (ref 6.4–8.9)
SODIUM: 141 mmol/L (ref 136–145)

## 2021-05-31 LAB — LIPID PANEL
CHOL/HDL RATIO: 2.4
CHOLESTEROL: 239 mg/dL (ref 136–290)
HDL CHOL: 100 mg/dL — ABNORMAL HIGH (ref 23–92)
LDL CALC: 104 mg/dL — ABNORMAL HIGH (ref 0–100)
TRIGLYCERIDES: 173 mg/dL — ABNORMAL HIGH (ref <=150)
VLDL CALC: 35 mg/dL (ref 0–50)

## 2021-05-31 LAB — THYROID STIMULATING HORMONE (SENSITIVE TSH): TSH: 2.944 u[IU]/mL (ref 0.450–5.330)

## 2021-05-31 LAB — VITAMIN D 25 TOTAL: VITAMIN D: 44 ng/mL (ref 30–100)

## 2021-05-31 LAB — MAGNESIUM: MAGNESIUM: 2.1 mg/dL (ref 1.9–2.7)

## 2021-05-31 NOTE — Progress Notes (Signed)
INTERNAL MEDICINE, [place]  [address] [address]    History and Physical     Name: [name] MRN:  [id number]   Date: 05/31/2021 Age: 71 y.o.           PCP: Delaney Meigs, PA-C     Reason for Visit: Pre Surgical Visit    History of Present Illness  [name] is a 71 y.o. female who is being seen today for preoperative optimization.     Patient is scheduled to have plastic surgery by Dr Temple Pacini   This surgery is considered to be Low/Med/High: Low risk for cardiac events.  To be performed  April 17th 2023      - Patient denies any of the following Major Clinical Predictors:  Unstable coronary artery disease ( Recent MI, unstable angina),  Decompensated heart failure, Significant arrythmias, or Severe valvular disease.   - Patient denies any of the following Intermediate Clinical Predictors:  Mild angina pectoris, Prior MI, Compensated heart failure, Diabetes Mellitus, or Renal insufficency.   - Patient denies the following Minor Clinical Predictors:  Abormal ECG, History of rhythm other than sinus, Low functional capacity, History of stroke, or Uncontrolled systemic hypertension.     Pre-Op Consult- AMB  Surgery Details  Surgeon:Dr Finical  Site/venue:[facility]  Planned procedure: Yes  Details: facial lift revision  Pre-Op Consult  New or exacerbated medical problems:NONE  Previous surgical intolerances: No  Previous surgical complications: No    Cardiovascular Assessment:  Active cardiac conditions: none  Current symptoms: Reports none; Denies chest pain, dyspnea, dyspnea on exertion, palpitations, dizziness, pre-syncope, syncope, leg edema, orthopnea, paroxysmal nocturnal dyspnea or other  Pertinent history: none  Associated cardiovascular risk factors:   Beta blockers: not used  LV function (LVEF): unknown  Previous history of myocardial infarction: No  Cardiac catheterization done: No  Functional Capacity: If  patients is able to do 4-10 METS of exercise without symptoms, their risk for surgery is lower: Strenuous sports:swim,tennis,football,basketball,ski (>10METS)  Functional capacity: adequate (>4 mets)  Pulmonary Assessment  Pulmonary risk factors: pt uses  Inhaler infrequent  And stable    Most recent Renal Function Results:  Tests:: gfr 74  05/31/2021  Coagulation  ASA (acetylsalicylic acid):   Other anticoagulants: Not used  Immunizations  Last Tdap:: Feb 2022   Last Influenza:: 12/30/2020  Assessment  Surgery: Elective  Comments:     Stop any NSAID'S  5 days  Before surgery  In April      She has  had anesthesia in the past and has not had an anesthesia reaction.     Pt is low risk for surgery and is cleared     Labs reviewed       Past Medical History:   Diagnosis Date   . Asthma    . Barrett's esophagus     egd 03/2018 patel  q 3 years   . Chronic idiopathic constipation    . Edema    . Esophageal reflux    . Hiatal hernia    . IBS (irritable colon syndrome)    . Menopausal flushing          Past Surgical History:   Procedure Laterality Date   . COLONOSCOPY N/A     2018 due 5 years   . HX DILATION AND CURETTAGE N/A    . HX HIP REPLACEMENT Right     fx  dr gardner in [place]   .  HX HYSTERECTOMY     . HX UPPER ENDOSCOPY N/A          Medication:  bethanechol chloride (URECHOLINE) 5 mg Oral Tablet, Take 1 Tablet (5 mg total) by mouth Twice daily  biotin 1 mg Oral Capsule, Take 1 Capsule (1 mg total) by mouth Once a day  Black Cohosh 200 mg Oral Capsule, Take 1 Capsule (200 mg total) by mouth Once a day  calcium carbonate (TUMS) 200 mg calcium (500 mg) Oral Tablet, Chewable, Chew 2 Tablets (1,000 mg total) Once a day  estradioL (ESTRACE) 2 mg Oral Tablet, Take 1 Tablet (2 mg total) by mouth Once a day  famotidine (PEPCID) 40 mg Oral Tablet, Take 1 Tablet (40 mg total) by mouth Twice daily  furosemide (LASIX) 20 mg Oral Tablet, Take 1 Tablet (20 mg total) by mouth Once a day (Patient not taking: Reported on  05/31/2021)  ipratropium bromide (ATROVENT) 42 mcg (0.06 %) Nasal Spray, Non-Aerosol, Administer 2 Sprays into affected nostril(s) Three times a day  L.crispat,gasseri,jensen,rhamn (AZO COMPLETE FEMININE BALANCE) 5 billion cell Oral Capsule, Take 1 Capsule by mouth Once a day (Patient not taking: Reported on 05/31/2021)  linaCLOtide (LINZESS) 290 mcg Oral Capsule, Take 1 Capsule (290 mcg total) by mouth Every morning  Magnesium Oxide 500 mg Oral Capsule, Take 1 Capsule (500 mg total) by mouth Once a day  omeprazole (PRILOSEC) 40 mg Oral Capsule, Delayed Release(E.C.), Take 1 Capsule (40 mg total) by mouth Once a day    No facility-administered medications prior to visit.    Allergies:  Allergies   Allergen Reactions   . Sulfa (Sulfonamides) Itching       Family Medical History:     Problem Relation (Age of Onset)    Congestive Heart Failure Mother    Diabetes Sister    Uterine Cancer Sister          Social History     Tobacco Use   . Smoking status: Never   . Smokeless tobacco: Never   Vaping Use   . Vaping Use: Never used   Substance Use Topics   . Alcohol use: Not Currently   . Drug use: Never     Social History     Social History Narrative   . Not on file       Nursing Notes:   Michelle Nasuti, [place]  05/31/21 [id number]  Signed  Pre op clearance for plastic surgery      Physical Exam:  Vitals:    05/31/21 0844   BP: 110/66   Pulse: 71   Temp: 36.4 C (97.6 F)   SpO2: 96%   Weight: 46.3 kg (102 lb)   Height: 1.6 m (5\' 3" )   BMI: 18.11      Physical Exam   Exam-AMB  Const  General: cooperative, healthy appearing and no acute distress  Orientation/Consciousness: patient oriented x3  HENMT  Ears: hearing grossly normal bilaterally  Eyes  General: appearance normal, both eyes and all related structures  Conjunctivae: conjunctivae normal  Sclera: sclerae normal  EOM: EOM intact bilaterally  Neck  Neck: normal visual inspection and no lymphadenopathy  Thyroid: thyroid normal  Carotids: left carotid  Bruit   ( old  finding)  Resp  Effort & Inspection: normal respiratory effort  Auscultation: clear to auscultation bilaterally, no crackles, no rales, no rhonchi and no wheezes  Cardio  Jugular venous pressure: no JVD  Rate: regular rate  Rhythm: regular rhythm  Heart Sounds: S1 normal,  S2 normal, no click, no murmurs and no rubs  Bruits: left carotid  Bruit     Right wo bruit   GI  Inspection: Yes normal to inspection  Palpation: soft, no hepatosplenomegaly, no guarding, no masses and nontender  Auscultation: normal bowel sounds  Neuro  General: patient oriented x3  Extrem  General: normal to inspection, normal exam except as noted, clubbing, cyanosis or edema noted, normal gait and other  Psych  Mental Status: mental status grossly normal  Mood: congruent mood  Affect: normal affect  Insight: insight good  Judgment: judgment good    Assessment/Plan:    ICD-10-CM    1. Pre-operative clearance  Z01.818 EKG (93000)- Performed and Read in Office     COMPREHENSIVE METABOLIC PANEL, NON-FASTING     CBC/DIFF     THYROID STIMULATING HORMONE (SENSITIVE TSH)     LIPID PANEL     VITAMIN D 25 TOTAL      2. Asthma, unspecified asthma severity, unspecified whether complicated, unspecified whether persistent  J45.909 EKG (93000)- Performed and Read in [place], NON-FASTING     CBC/DIFF     THYROID STIMULATING HORMONE (SENSITIVE TSH)     LIPID PANEL     VITAMIN D 25 TOTAL      3. Barrett's esophagus  K22.70 EKG (93000)- Performed and Read in Office     COMPREHENSIVE METABOLIC PANEL, NON-FASTING     CBC/DIFF     THYROID STIMULATING HORMONE (SENSITIVE TSH)     LIPID PANEL     VITAMIN D 25 TOTAL      4. Chronic idiopathic constipation  K59.04 EKG (93000)- Performed and Read in Office     COMPREHENSIVE METABOLIC PANEL, NON-FASTING     CBC/DIFF     THYROID STIMULATING HORMONE (SENSITIVE TSH)     LIPID PANEL     VITAMIN D 25 TOTAL      5. Edema, unspecified type  R60.9 EKG (93000)- Performed and Read in [place], NON-FASTING     CBC/DIFF     THYROID STIMULATING HORMONE (SENSITIVE TSH)     LIPID PANEL     VITAMIN D 25 TOTAL      6. Gastroesophageal reflux disease, unspecified whether esophagitis present  K21.9 EKG (93000)- Performed and Read in [place], NON-FASTING     CBC/DIFF     THYROID STIMULATING HORMONE (SENSITIVE TSH)     LIPID PANEL     VITAMIN D 25 TOTAL      7. Hiatal hernia  K44.9 EKG (93000)- Performed and Read in [place], NON-FASTING     CBC/DIFF     THYROID STIMULATING HORMONE (SENSITIVE TSH)     LIPID PANEL     VITAMIN D 25 TOTAL      8. Irritable bowel syndrome, unspecified type  K58.9 EKG (93000)- Performed and Read in [place], NON-FASTING     CBC/DIFF     THYROID STIMULATING HORMONE (SENSITIVE TSH)     LIPID PANEL     VITAMIN D 25 TOTAL      9. Hypomagnesemia  E83.42 MAGNESIUM      10. Left carotid bruit  R09.89 CAROTID ARTERY DUPLEX      11. Vitamin D deficiency  E55.9 VITAMIN D 25 TOTAL        Pt is low risk for  facial cosmetic  Surgery   Carotid US has been ordered  In fu  Of pt's left cartoid  Bruit    EKG NSR occ PVC   Minimal voltage  Criteria  May be normal variant     Labs reviewed   Meds reviewed as well as labs.  Chart reviewed and updated.   Continue current treatment.  Keep follow-up appointment.   Vaccine hx reviewed.     Follow up: Return in about 6 months (around 12/01/2021).  Seek medical attention for new or worsening symptoms.  Patient has been seen in this clinic within the last 3 years.     Delaney Meigs, PA-C          This note was partially created using MModal Fluency Direct system (voice recognition software) and is inherently subject to errors including those of syntax and "sound-alike" substitutions which may escape proofreading.  In such instances, original meaning may be extrapolated by contextual derivation.

## 2021-05-31 NOTE — Nursing Note (Signed)
Pre op clearance for plastic surgery

## 2021-06-03 ENCOUNTER — Encounter (RURAL_HEALTH_CENTER): Payer: Self-pay | Admitting: Internal Medicine

## 2021-06-14 ENCOUNTER — Inpatient Hospital Stay
Admission: RE | Admit: 2021-06-14 | Discharge: 2021-06-14 | Disposition: A | Discharge status: Home / Self Care | Payer: Medicare Other | Source: Ambulatory Visit | Attending: Internal Medicine | Admitting: Internal Medicine

## 2021-06-14 ENCOUNTER — Other Ambulatory Visit: Payer: Self-pay

## 2021-06-14 DIAGNOSIS — R0989 Other specified symptoms and signs involving the circulatory and respiratory systems: Secondary | ICD-10-CM | POA: Insufficient documentation

## 2021-06-15 DIAGNOSIS — R0989 Other specified symptoms and signs involving the circulatory and respiratory systems: Secondary | ICD-10-CM

## 2021-06-21 NOTE — Addendum Note (Signed)
Addended by: Trevor Mace on: 06/21/2021 02:12 AM     Modules accepted: Orders

## 2021-07-05 ENCOUNTER — Telehealth (RURAL_HEALTH_CENTER): Payer: Self-pay | Admitting: Internal Medicine

## 2021-07-16 ENCOUNTER — Other Ambulatory Visit (RURAL_HEALTH_CENTER): Payer: Self-pay | Admitting: Internal Medicine

## 2021-07-16 MED ORDER — LINACLOTIDE 290 MCG CAPSULE
290.0000 ug | ORAL_CAPSULE | Freq: Every morning | ORAL | 2 refills | Status: DC
Start: 2021-07-16 — End: 2022-03-27

## 2021-09-03 ENCOUNTER — Other Ambulatory Visit (INDEPENDENT_AMBULATORY_CARE_PROVIDER_SITE_OTHER): Payer: Self-pay | Admitting: Internal Medicine

## 2021-10-07 ENCOUNTER — Other Ambulatory Visit (INDEPENDENT_AMBULATORY_CARE_PROVIDER_SITE_OTHER): Payer: Self-pay | Admitting: Internal Medicine

## 2021-10-08 ENCOUNTER — Other Ambulatory Visit (HOSPITAL_COMMUNITY): Payer: Self-pay | Admitting: Orthopaedic Surgery

## 2021-10-08 DIAGNOSIS — M545 Low back pain, unspecified: Secondary | ICD-10-CM

## 2021-10-09 ENCOUNTER — Other Ambulatory Visit (HOSPITAL_COMMUNITY): Payer: Self-pay | Admitting: Orthopaedic Surgery

## 2021-10-09 DIAGNOSIS — M545 Low back pain, unspecified: Secondary | ICD-10-CM

## 2021-10-13 ENCOUNTER — Ambulatory Visit (HOSPITAL_COMMUNITY): Payer: Self-pay

## 2021-10-16 ENCOUNTER — Other Ambulatory Visit (INDEPENDENT_AMBULATORY_CARE_PROVIDER_SITE_OTHER): Payer: Self-pay | Admitting: Internal Medicine

## 2021-10-16 MED ORDER — FAMOTIDINE 40 MG TABLET
40.0000 mg | ORAL_TABLET | Freq: Two times a day (BID) | ORAL | 0 refills | Status: DC
Start: 2021-10-16 — End: 2022-01-11

## 2021-10-22 ENCOUNTER — Inpatient Hospital Stay
Admission: RE | Admit: 2021-10-22 | Discharge: 2021-10-22 | Disposition: A | Discharge status: Home / Self Care | Payer: Medicare Other | Source: Ambulatory Visit | Attending: Orthopaedic Surgery | Admitting: Orthopaedic Surgery

## 2021-10-22 ENCOUNTER — Other Ambulatory Visit: Payer: Self-pay

## 2021-10-22 ENCOUNTER — Inpatient Hospital Stay (HOSPITAL_BASED_OUTPATIENT_CLINIC_OR_DEPARTMENT_OTHER)
Admission: RE | Admit: 2021-10-22 | Discharge: 2021-10-22 | Disposition: A | Discharge status: Home / Self Care | Payer: Medicare Other | Source: Ambulatory Visit | Attending: Orthopaedic Surgery | Admitting: Orthopaedic Surgery

## 2021-10-22 DIAGNOSIS — M545 Low back pain, unspecified: Secondary | ICD-10-CM | POA: Insufficient documentation

## 2021-10-22 DIAGNOSIS — M79605 Pain in left leg: Secondary | ICD-10-CM | POA: Insufficient documentation

## 2021-10-23 ENCOUNTER — Telehealth (RURAL_HEALTH_CENTER): Payer: Self-pay | Admitting: Internal Medicine

## 2021-10-23 MED ORDER — CIPROFLOXACIN 500 MG TABLET
500.0000 mg | ORAL_TABLET | Freq: Two times a day (BID) | ORAL | 0 refills | Status: AC
Start: 2021-10-23 — End: 2021-10-30

## 2021-10-23 MED ORDER — FLUCONAZOLE 150 MG TABLET
150.0000 mg | ORAL_TABLET | Freq: Once | ORAL | 0 refills | Status: DC
Start: 2021-10-23 — End: 2022-01-10

## 2021-12-05 ENCOUNTER — Ambulatory Visit (INDEPENDENT_AMBULATORY_CARE_PROVIDER_SITE_OTHER): Payer: Self-pay | Admitting: Internal Medicine

## 2021-12-28 ENCOUNTER — Other Ambulatory Visit (INDEPENDENT_AMBULATORY_CARE_PROVIDER_SITE_OTHER): Payer: Self-pay | Admitting: Internal Medicine

## 2021-12-28 ENCOUNTER — Telehealth (INDEPENDENT_AMBULATORY_CARE_PROVIDER_SITE_OTHER): Payer: Self-pay | Admitting: Internal Medicine

## 2021-12-28 DIAGNOSIS — M5430 Sciatica, unspecified side: Secondary | ICD-10-CM

## 2021-12-28 DIAGNOSIS — M25559 Pain in unspecified hip: Secondary | ICD-10-CM

## 2022-01-10 ENCOUNTER — Other Ambulatory Visit (RURAL_HEALTH_CENTER): Payer: Self-pay | Admitting: Internal Medicine

## 2022-01-11 ENCOUNTER — Other Ambulatory Visit (INDEPENDENT_AMBULATORY_CARE_PROVIDER_SITE_OTHER): Payer: Self-pay | Admitting: Internal Medicine

## 2022-01-25 ENCOUNTER — Telehealth (INDEPENDENT_AMBULATORY_CARE_PROVIDER_SITE_OTHER): Payer: Self-pay | Admitting: Internal Medicine

## 2022-01-25 MED ORDER — MELOXICAM 15 MG TABLET
15.0000 mg | ORAL_TABLET | Freq: Every day | ORAL | 1 refills | Status: DC
Start: 2022-01-25 — End: 2022-07-29

## 2022-01-28 ENCOUNTER — Ambulatory Visit (INDEPENDENT_AMBULATORY_CARE_PROVIDER_SITE_OTHER): Payer: Self-pay | Admitting: Internal Medicine

## 2022-02-18 ENCOUNTER — Other Ambulatory Visit: Payer: Self-pay

## 2022-02-18 ENCOUNTER — Ambulatory Visit: Payer: Medicare Other | Attending: Internal Medicine | Admitting: Internal Medicine

## 2022-02-18 ENCOUNTER — Encounter (INDEPENDENT_AMBULATORY_CARE_PROVIDER_SITE_OTHER): Payer: Self-pay | Admitting: Internal Medicine

## 2022-02-18 VITALS — BP 110/68 | HR 79 | Temp 97.9°F | Ht 62.0 in | Wt 102.0 lb

## 2022-02-18 DIAGNOSIS — Z0000 Encounter for general adult medical examination without abnormal findings: Secondary | ICD-10-CM | POA: Insufficient documentation

## 2022-02-18 DIAGNOSIS — E785 Hyperlipidemia, unspecified: Secondary | ICD-10-CM | POA: Insufficient documentation

## 2022-02-18 DIAGNOSIS — K2270 Barrett's esophagus without dysplasia: Secondary | ICD-10-CM | POA: Insufficient documentation

## 2022-02-18 DIAGNOSIS — E559 Vitamin D deficiency, unspecified: Secondary | ICD-10-CM | POA: Insufficient documentation

## 2022-02-18 DIAGNOSIS — K219 Gastro-esophageal reflux disease without esophagitis: Secondary | ICD-10-CM | POA: Insufficient documentation

## 2022-02-18 DIAGNOSIS — K5904 Chronic idiopathic constipation: Secondary | ICD-10-CM | POA: Insufficient documentation

## 2022-02-18 LAB — THYROID STIMULATING HORMONE (SENSITIVE TSH): TSH: 2.837 u[IU]/mL (ref 0.450–5.330)

## 2022-02-18 LAB — COMPREHENSIVE METABOLIC PNL, FASTING
ALBUMIN/GLOBULIN RATIO: 1.6 — ABNORMAL HIGH (ref 0.8–1.4)
ALBUMIN: 4.1 g/dL (ref 3.5–5.7)
ALKALINE PHOSPHATASE: 46 U/L (ref 34–104)
ALT (SGPT): 16 U/L (ref 7–52)
ANION GAP: 4 mmol/L (ref 4–13)
AST (SGOT): 22 U/L (ref 13–39)
BILIRUBIN TOTAL: 0.4 mg/dL (ref 0.3–1.2)
BUN/CREA RATIO: 26 — ABNORMAL HIGH (ref 6–22)
BUN: 20 mg/dL (ref 7–25)
CALCIUM, CORRECTED: 8.7 mg/dL — ABNORMAL LOW (ref 8.9–10.8)
CALCIUM: 8.8 mg/dL (ref 8.6–10.3)
CHLORIDE: 107 mmol/L (ref 98–107)
CO2 TOTAL: 29 mmol/L (ref 21–31)
CREATININE: 0.77 mg/dL (ref 0.60–1.30)
ESTIMATED GFR: 83 mL/min/{1.73_m2} (ref >59)
GLOBULIN: 2.6 — ABNORMAL LOW (ref 2.9–5.4)
GLUCOSE: 80 mg/dL (ref 74–109)
OSMOLALITY, CALCULATED: 281 mOsm/kg (ref 270–290)
POTASSIUM: 4 mmol/L (ref 3.5–5.1)
PROTEIN TOTAL: 6.7 g/dL (ref 6.4–8.9)
SODIUM: 140 mmol/L (ref 136–145)

## 2022-02-18 LAB — LIPID PANEL
CHOL/HDL RATIO: 2.2
CHOLESTEROL: 218 mg/dL — ABNORMAL HIGH (ref <200)
HDL CHOL: 98 mg/dL — ABNORMAL HIGH (ref 23–92)
LDL CALC: 87 mg/dL (ref 0–100)
TRIGLYCERIDES: 167 mg/dL — ABNORMAL HIGH (ref <=150)
VLDL CALC: 33 mg/dL (ref 0–50)

## 2022-02-18 LAB — CBC WITH DIFF
BASOPHIL #: 0 10*3/uL (ref 0.00–0.10)
BASOPHIL %: 1 % (ref 0–1)
EOSINOPHIL #: 0.2 10*3/uL (ref 0.00–0.50)
EOSINOPHIL %: 3 %
HCT: 38.3 % (ref 31.2–41.9)
HGB: 12.6 g/dL (ref 10.9–14.3)
LYMPHOCYTE #: 1.4 10*3/uL (ref 1.00–3.00)
LYMPHOCYTE %: 22 % (ref 16–44)
MCH: 31.1 pg (ref 24.7–32.8)
MCHC: 33 g/dL (ref 32.3–35.6)
MCV: 94.4 fL (ref 75.5–95.3)
MONOCYTE #: 0.5 10*3/uL (ref 0.30–1.00)
MONOCYTE %: 8 % (ref 5–13)
MPV: 9.3 fL (ref 7.9–10.8)
NEUTROPHIL #: 4.2 10*3/uL (ref 1.85–7.80)
NEUTROPHIL %: 66 % (ref 43–77)
PLATELETS: 309 10*3/uL (ref 140–440)
RBC: 4.06 10*6/uL (ref 3.63–4.92)
RDW: 13.2 % (ref 12.3–17.7)
WBC: 6.4 10*3/uL (ref 3.8–11.8)

## 2022-02-18 LAB — VITAMIN D 25 TOTAL: VITAMIN D: 36 ng/mL (ref 30–100)

## 2022-02-18 MED ORDER — OMEPRAZOLE 40 MG CAPSULE,DELAYED RELEASE
40.0000 mg | DELAYED_RELEASE_CAPSULE | Freq: Two times a day (BID) | ORAL | 2 refills | Status: DC
Start: 2022-02-18 — End: 2022-07-29

## 2022-02-18 MED ORDER — FAMOTIDINE 40 MG TABLET
40.0000 mg | ORAL_TABLET | Freq: Every evening | ORAL | 3 refills | Status: DC
Start: 2022-02-18 — End: 2022-06-07

## 2022-02-18 NOTE — Progress Notes (Signed)
INTERNAL MEDICINE, [facility]  [address]  [place] [address]  Operated by [facility]  Medicare Annual Wellness Visit    Name: [name] MRN:  [id number]   Date: 02/18/2022 Age: 71 y.o.       SUBJECTIVE:   [name] is a 71 y.o. female for presenting for Medicare Wellness exam.   I have reviewed and reconciled the medication list with the patient today.        02/18/2022     9:15 AM   Comprehensive Health Assessment-Adult   Do you wish to complete this form? Yes   During the past 4 weeks, how would you rate your health in general? Excellent   During the past 4 weeks, how much difficulty have you had doing your usual activities inside and outside your home because of medical or emotional problems? No difficulty at all   During the past 4 weeks, was someone available to help you if you needed and wanted help? Yes, as much as I wanted   In the past year, how many times have you gone to the emergency department or been admitted to a hospital for a health problem? None   Are you generally satisfied with your sleep? Yes   Do you have enough money to buy things you need in everyday life, such as food, clothing, medicines, and housing? Yes, always   Can you get to places beyond walking distance without help?  (For example, can you drive your own car or travel alone on buses)? Yes   Do you fasten your seatbelt when you are in a car? Yes, usually   Do you exercise 20 minutes 3 or more days per week (such as walking, dancing, biking, mowing grass, swimming)? Yes, most of the time   How often do you eat food that is healthy (fruits, vegetables, lean meats) instead of unhealthy (sweets, fast food, junk food, fatty foods)? Most of the time   Have your parents, brothers or sisters had any of the following problems before the age of 44? (check all that apply) Mental health problems such as depression, bipolar, severe anxiety, postpartum depression;Diabetes (sugar);Alcohol or drug  addiction (or abuse);Cancer;High cholesterol   How often do you have trouble taking medicines the eay you are told to take them? I always take them as prescribed   Do you need any help communicating with your doctors and nurses because of vision or hearing problems? No   During the past 12 months, have you experienced confusion or memory loss that is happening more often or is getting worse? No   Do you have one person you think of as your personal doctor (primary care provider or family doctor)? Yes   If you are seeing a Primary Care Provider (PCP) or family doctor. please list their name Jeral Pinch   Are you now also seeing any specialist physician(s) (such as eye doctor, foot doctor, skin doctor)? Yes   If you are seeing a specialist for anything such as foot, eye, skin, etc.  please list their name(s) dr Lequita Halt ((greg southers)   How confident are you that you can control or manage most of your health problems? Very confident         I have reviewed and updated as appropriate the past medical, family and social history. 02/18/2022 as summarized below:  Past Medical History:   Diagnosis Date    Asthma     Barrett's esophagus     egd  03/2018 patel  q 3 years    Chronic idiopathic constipation     Edema     Esophageal reflux     Hiatal hernia     IBS (irritable colon syndrome)     Menopausal flushing      Past Surgical History:   Procedure Laterality Date    Colonoscopy N/A     Hx dilation and curettage N/A     Hx hip replacement Right     Hx hysterectomy      Hx upper endoscopy N/A      Current Outpatient Medications   Medication Sig    albuterol sulfate (PROVENTIL OR VENTOLIN OR PROAIR) 90 mcg/actuation Inhalation oral inhaler INHALE TWO PUFFS BY MOUTH FOUR TIMES DAILY    bethanechol chloride (URECHOLINE) 5 mg Oral Tablet Take 1 Tablet (5 mg total) by mouth Twice daily    biotin 1 mg Oral Capsule Take 1 Capsule (1 mg total) by mouth Once a day (Patient not taking: Reported on 02/18/2022)    Black Cohosh 200  mg Oral Capsule Take 1 Capsule (200 mg total) by mouth Once a day    calcium carbonate (TUMS) 200 mg calcium (500 mg) Oral Tablet, Chewable Chew 2 Tablets (1,000 mg total) Once a day    estradioL (ESTRACE) 2 mg Oral Tablet TAKE ONE TABLET BY MOUTH ONCE EVERY DAY    famotidine (PEPCID) 40 mg Oral Tablet TAKE ONE TABLET BY MOUTH TWICE DAILY    fluconazole (DIFLUCAN) 100 mg Oral Tablet TAKE ONE TABLET BY MOUTH ONCE EVERY DAY (Patient not taking: Reported on 02/18/2022)    fluconazole (DIFLUCAN) 150 mg Oral Tablet TAKE ONE TABLET BY MOUTH ONE TIME FOR ONE DOSE (Patient not taking: Reported on 02/18/2022)    furosemide (LASIX) 20 mg Oral Tablet Take 1 Tablet (20 mg total) by mouth Once a day (Patient not taking: Reported on 05/31/2021)    ipratropium bromide (ATROVENT) 42 mcg (0.06 %) Nasal Spray, Non-Aerosol Administer 2 Sprays into affected nostril(s) Three times a day    L.crispat,gasseri,jensen,rhamn (AZO COMPLETE FEMININE BALANCE) 5 billion cell Oral Capsule Take 1 Capsule by mouth Once a day (Patient not taking: Reported on 05/31/2021)    lactobacillus combination no.4 (PROBIOTIC) 3 billion cell Oral Capsule Take by mouth    linaCLOtide (LINZESS) 290 mcg Oral Capsule Take 1 Capsule (290 mcg total) by mouth Every morning    Magnesium Oxide 500 mg Oral Capsule Take 1 Capsule (500 mg total) by mouth Once a day    meloxicam (MOBIC) 15 mg Oral Tablet Take 1 Tablet (15 mg total) by mouth Once a day    omeprazole (PRILOSEC) 40 mg Oral Capsule, Delayed Release(E.C.) Take 1 Capsule (40 mg total) by mouth Once a day     Family Medical History:       Problem Relation (Age of Onset)    Congestive Heart Failure Mother    Diabetes Sister    Uterine Cancer Sister            Social History     Socioeconomic History    Marital status: Married   Tobacco Use    Smoking status: Never    Smokeless tobacco: Never   Vaping Use    Vaping Use: Never used   Substance and Sexual Activity    Alcohol use: Not Currently    Drug use: Never      Social Determinants of Health     Financial Resource Strain: Low Risk  (05/31/2021)  Financial Resource Strain     SDOH Financial: No   Transportation Needs: Low Risk  (05/31/2021)    Transportation Needs     SDOH Transportation: No   Social Connections: Medium Risk (05/31/2021)    Social Connections     SDOH Social Isolation: 3 to 5 times a week   Intimate Partner Violence: Low Risk  (05/31/2021)    Intimate Partner Violence     SDOH Domestic Violence: No   Housing Stability: Low Risk  (05/31/2021)    Housing Stability     SDOH Housing Situation: I have housing.     SDOH Housing Worry: No   Health Literacy: Low Risk  (05/31/2021)    Health Literacy     SDOH Health Literacy: Never   Employment Status: Low Risk  (05/31/2021)    Employment Status     SDOH Employment: Otherwise unemployed but not seeking work (ex. Consulting civil engineerstudent, retired, disabled, unpaid primary care giver)         List of Current Health Care Providers   Care Team       PCP       Name Type Specialty Phone Number    Samuella CotaMatzel, Harlie Ragle A, [place]PA-C Physician Assistant EXTERNAL [phone]              Care Team       No care team found                      Health Maintenance   Topic Date Due    Osteoporosis screening  Never done    Hepatitis C screening  Never done    EGD Follow Up  Never done    Colonoscopy  Never done    Mammography  Never done    Medicare Annual Wellness Visit - Calendar Year Insurers  Never done    Covid-19 Vaccine (7 - 2023-24 season) 11/23/2021    Depression Screening  02/19/2023    Adult Tdap-Td (2 - Td or Tdap) 05/03/2030    Influenza Vaccine  Completed    Shingles Vaccine  Completed    Pneumococcal Vaccination, Age 42+  Completed    Meningococcal Vaccine  Aged Out     Medicare Wellness Assessment   Medicare initial or wellness physical in the last year?: Yes  Advance Directives   Does patient have a living will or MPOA: YES   Has patient provided [facility] with a copy?: no   Advance directive information given to the patient today?: no       Activities of Daily Living   Do you need help with dressing, bathing, or walking?: No   Do you need help with shopping, housekeeping, medications, or finances?: No   Do you have rugs in hallways, broken steps, or poor lighting?: No   Do you have grab bars in your bathroom, non-slip strips in your tub, and hand rails on your stairs?: No   Urinary Incontinence Screen   Do you ever leak urine when you don't want to?: No   Cognitive Function Screen (1=Yes, 0=No)   What is you age?: Correct   What is the time to the nearest hour?: Correct   What is the year?: Correct   What is the name of this clinic?: Correct   Can the patient recognize two persons (the doctor, the nurse, home help, etc.)?: Correct   What is the date of your birth? (day and month sufficient) : Correct   In what year did World  War II end?: Correct   Who is the current president of the [place]?: Correct   Count from 20 down to 1?: Correct   What address did I give you earlier?: Correct   Total Score: 10   Interpretation of Total Score: Greater than 6 Normal   Hearing Screen   Have you noticed any hearing difficulties?: Yes  After whispering 9-1-6 how many numbers did the patient repeat correctly?: 3   Fall Risk Screen   Do you feel unsteady when standing or walking?: No  Do you worry about falling?: No  Have you fallen in the past year?: No   Vision Screen           Depression Screen     Little interest or pleasure in doing things.: Not at all  Feeling down, depressed, or hopeless: Not at all  PHQ 2 Total: 0     Pain Score   Pain Score:   0 - No pain    Substance Use-Abuse Screening     Tobacco Use     In Past 12 MONTHS, how often have you used any tobacco product (for example, cigarettes, e-cigarettes, cigars, pipes, or smokeless tobacco)?: Never     Alcohol use     In the PAST 12 MONTHS, how often have you had 5 (men)/4 (women) or more drinks containing alcohol in one day?: Never     Prescription Drug Use     In the PAST 12 months, how often  have you used any prescription medications just for the feeling, more than prescribed, or that were not prescribed for you? Prescriptions may include: opioids, benzodiazepines, medications for ADHD: Never           Illicit Drug Use   In the PAST 12 MONTHS, how often have you used any drugs, including marijuana, cocaine or crack, heroin, methamphetamine, hallucinogens, ecstasy/MDMA?: Never         OBJECTIVE:   BP 110/68 (Site: Left, Patient Position: Sitting)   Pulse 79   Temp 36.6 C (97.9 F)   Ht 1.575 m ( )   Wt 46.3 kg (102 lb)   SpO2 99%   BMI 18.66 kg/m        Other appropriate exam:  Exam-AMB  Const  General: cooperative, healthy appearing and no acute distress  Orientation/Consciousness: patient oriented x3  HENMT  Ears: hearing grossly normal bilaterally  Eyes  General: appearance normal, both eyes and all related structures  Conjunctivae: conjunctivae normal  Sclera: sclerae normal  EOM: EOM intact bilaterally  Neck  Neck: normal visual inspection and no lymphadenopathy  Thyroid: thyroid normal  Carotids: no bruits  Resp  Effort & Inspection: normal respiratory effort  Auscultation: clear to auscultation bilaterally, no crackles, no rales, no rhonchi and no wheezes  Cardio  Jugular venous pressure: no JVD  Rate: regular rate  Rhythm: regular rhythm  Heart Sounds: S1 normal, S2 normal, no click, no murmurs and no rubs  Bruits: no carotid bruits  GI  Inspection: Yes normal to inspection  Palpation: soft, no hepatosplenomegaly, no guarding, no masses and nontender  Auscultation: normal bowel sounds  Neuro  General: patient oriented x3  Extrem  General: normal to inspection, normal exam except as noted, clubbing, cyanosis or edema noted, normal gait and other  Psych  Mental Status: mental status grossly normal  Mood: congruent mood  Affect: normal affect  Insight: insight good  Judgment: judgment good       Health Maintenance Due  Topic Date Due    Osteoporosis screening  Never done    Hepatitis C  screening  Never done    EGD Follow Up  Never done    Colonoscopy  Never done    Mammography  Never done    Medicare Annual Wellness Visit - Calendar Year Insurers  Never done    Covid-19 Vaccine (7 - 2023-24 season) 11/23/2021      ASSESSMENT & PLAN:   Assessment/Plan   1. Medicare annual wellness visit, subsequent       Identified Risk Factors/ Recommended Actions       The PHQ 2 Total: 0 depression screen is interpreted as negative.          No orders of the defined types were placed in this encounter.         The patient has been educated about risk factors and recommended preventive care. Written Prevention Plan completed/ updated and given to patient (see After Visit Summary).    Meds reviewed as well as labs.  Chart reviewed and updated.   Continue current treatment.  Keep follow-up appointment.   Vaccine hx reviewed.    Keep fu  with  [facility]     Pt to fu with Ortho and  mri     Colonoscopy   Jan  2024   EGD  last week   Flu rsv and covid  up to date       Samuella Cota, PA-C

## 2022-02-18 NOTE — Patient Instructions (Signed)
Medicare Preventive Services  Medicare coverage information Recommendation for YOU   Heart Disease and Diabetes   Lipid profile Every 5 years or more often if at risk for cardiovascular disease  Last Lipid Panel  (Last result in the past 2 years)      Cholesterol   HDL   LDL   Direct LDL   Triglycerides        05/31/21 0930 239   100   104     173            Diabetes Screening  yearly for those at risk for diabetes, 2 tests per year for those with prediabetes Last Glucose: 88    Diabetes Self Management Training or Medical Nutrition Therapy  For those with diabetes, up to 10 hrs initial training within a year, subsequent years up to 2 hrs of follow up training Optional for those with diabetes     Medical Nutrition Therapy Three hours of one-on-one counseling in first year, two hours in subsequent years Optional for those with diabetes, kidney disease   Intensive Behavioral Therapy for Obesity  Face-to-face counseling, first month every week, month 2-6 every other week, month 7-12 every month if continued progress is documented Optional for those with Body Mass Index 30 or higher  Your Body mass index is 18.66 kg/m.   Tobacco Cessation (Quitting) Counseling   Two attempts per year, max 4 sessions per attempt, up to 8 per year, for those with tobacco-related health condition Optional for those that use tobacco   Cancer Screening   Colorectal screening   For anyone age 69 to 36 or any age if high risk:  Screening Colonoscopy every 10 yrs if low risk,  more frequent if higher risk  OR  Cologuard Stool DNA test once every 3 years OR  Fecal Occult Blood Testing yearly OR  Flexible  Sigmoidoscopy  every 5 yr OR  CT Colonography every 5 yrs    See your schedule below   Screening Pap Test Recommended every 3 years for all women age 93 to 21, or every five years if combined with HPV test (routine screening not needed after total hysterectomy).  Medicare covers every 2 years, up to yearly if high risk.  Screening Pelvic Exam  Medicare covers every 2 years, yearly if high risk or childbearing age with abnormal Pap in last 3 yrs. See your schedule below   Screening Mammogram   Recommended every 2 years for women age 72 to 4, or more frequent if you have a higher risk. Selectively recommended for women between 40-49 based on shared decisions about risk. Covered by Medicare up to every year for women age 69 or older See your schedule below      Lung Cancer Screening  Annual low dose computed tomography (LDCT scan) is recommended for those age 53-77 who smoked 20 pack-years and are current smokers or quit smoking within past 15 years (one pack-year= smoking one PPD for one year), after counseling by your doctor or nurse clinician about the possible benefits or harms. See your schedule below   Vaccinations   Pneumococcal Vaccine: Recommended routinely age 13+ with one or two separate vaccines based on your risk    Recommended before age 47 if medical conditions with increased risk  Seasonal Influenza Vaccine: Once every flu season   Hepatitis B Vaccine: 3 doses if risk (including anyone with diabetes or liver disease)  Shingles Vaccine: Two doses at age 29 or older  Diphtheria  Tetanus Pertussis Vaccine: ONCE as adult, booster every 10 years     Immunization History   Administered Date(s) Administered   . Covid-19 Vaccine,Pfizer Bivalent,54mcg/0.3ml,12 yrs+ 01/10/2021   . Covid-19 Vaccine,Pfizer-BioNTech,Purple Top,57yrs+ 04/24/2019, 05/13/2019, 06/28/2020   . H1n1 Flu Vaccine Injection (Admin) 02/03/2008   . High-Dose Influenza Vaccine, 65+ 12/30/2020, 12/12/2021   . PREVNAR 13 09/23/2017     Shingles vaccine and Diphtheria Tetanus Pertussis vaccines are available at pharmacies or local health department without a prescription.   Other Screening   Bone Densitometry   Every 24 months for anyone at risk, including postmenopausal       Glaucoma Screening   Yearly if in high risk group such as diabetes, family history, African American age 6+ or  Hispanic American age 25+   See your Eye Care Provider   Hepatitis C Screening recommended ONCE for those born between 1945-1965, or high risk for HCV infection       HIV Testing recommended routinely at least ONCE, covered every year for age 91 to 45 regardless of risk, and every year for age over 92 who ask for the test or higher risk  Yearly or up to 3 times in pregnancy         Abdominal Aortic Aneurysm Screening Ultrasound   Once between the age of 23-75 with a family history of AAA       Your Personalized Schedule for Preventive Tests   Health Maintenance: Pending and Last Completed       Date Due Completion Date    Osteoporosis screening Never done ---    Hepatitis C screening Never done ---    EGD Follow Up Never done ---    Colonoscopy Never done ---    Mammography Never done ---    Medicare Annual Wellness Visit - Calendar Year Insurers Never done ---    Covid-19 Vaccine (7 - 2023-24 season) 11/23/2021 01/10/2021    Depression Screening 02/19/2023 02/18/2022    Adult Tdap-Td (2 - Td or Tdap) 05/03/2030 05/03/2020

## 2022-02-19 ENCOUNTER — Telehealth (INDEPENDENT_AMBULATORY_CARE_PROVIDER_SITE_OTHER): Payer: Self-pay | Admitting: Internal Medicine

## 2022-02-22 IMAGING — MR MRI HIP LT W/O CONTRAST
4 of 6 series · 26 of 40 positions shown · IV contrast (gadolinium)
Comparison: None available.

﻿EXAM:  [id number]   MRI HIP LT W/O CONTRAST
INDICATION: 71-year-old with history of persistent left hip pain.  History of trauma to the left hip while playing pickleball.  No history of surgery of left hip.  History of right hip arthroplasty.
TECHNIQUE: Multiplanar multisequential MRI of the pelvis including the left hip was performed without gadolinium contrast.

[Series 10: T2 fat-sat · axial · 4.5mm · 0.80mm/px · z∈[-25,+120]mm · 8 of 30 slices shown (1 of 3)]
[im 1/30]
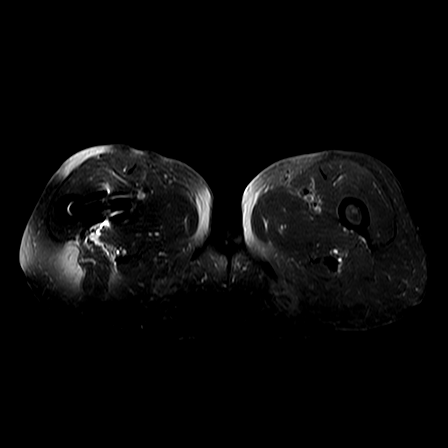
[im 5/30]
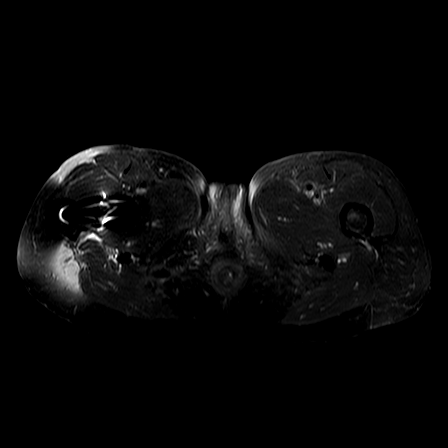
[im 9/30]
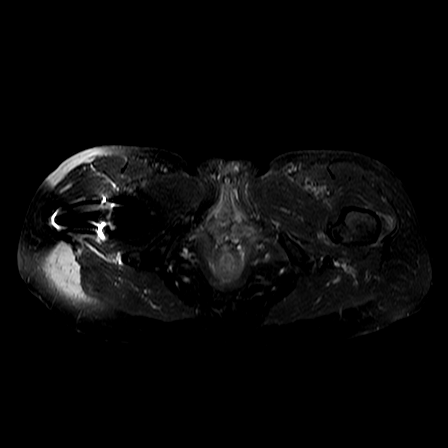
[im 13/30]
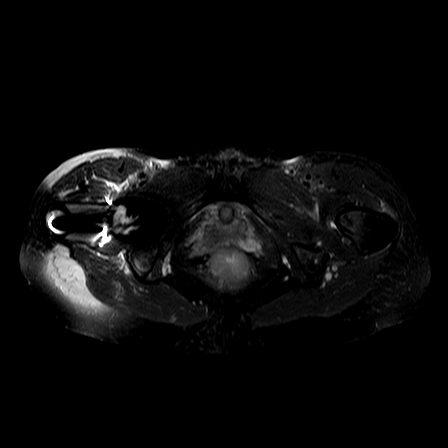
[im 17/30]
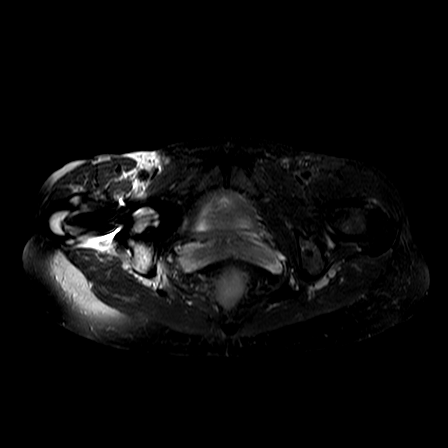
[im 21/30]
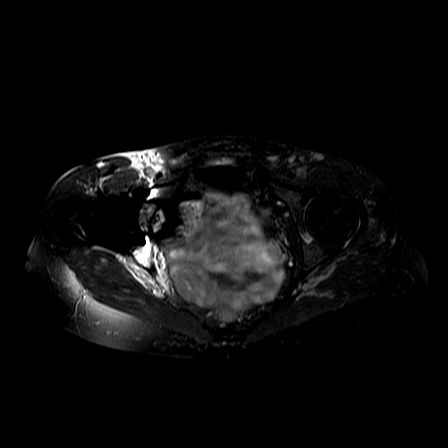
[im 25/30]
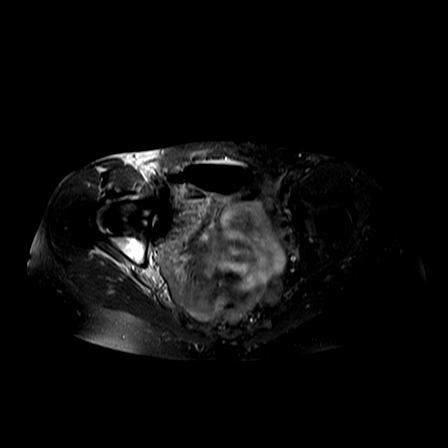
[im 30/30]
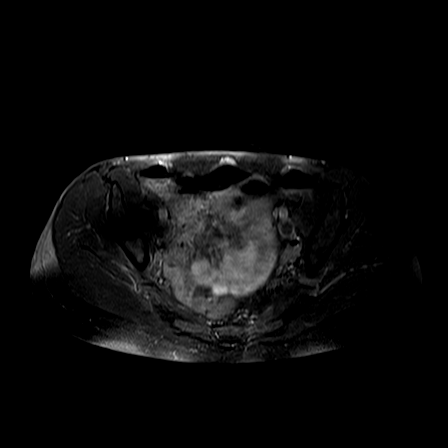

[Series 11: T1 · axial · 4.5mm · 0.70mm/px · z∈[-25,+95]mm · 6 of 30 slices shown]
[im 1/30]
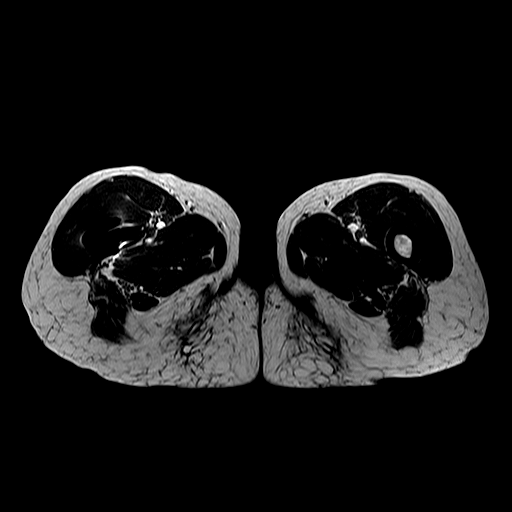
[im 5/30]
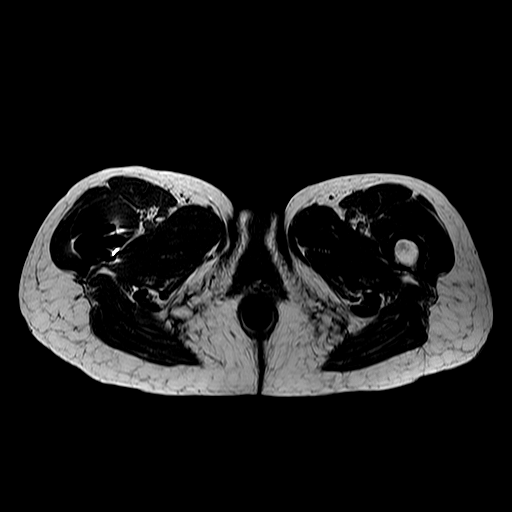
[im 10/30]
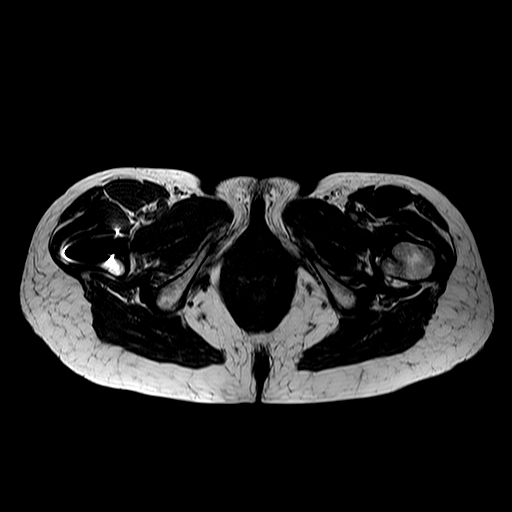
[im 15/30]
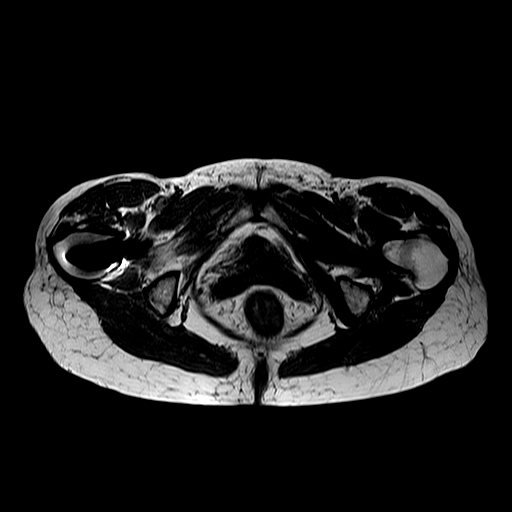
[im 20/30]
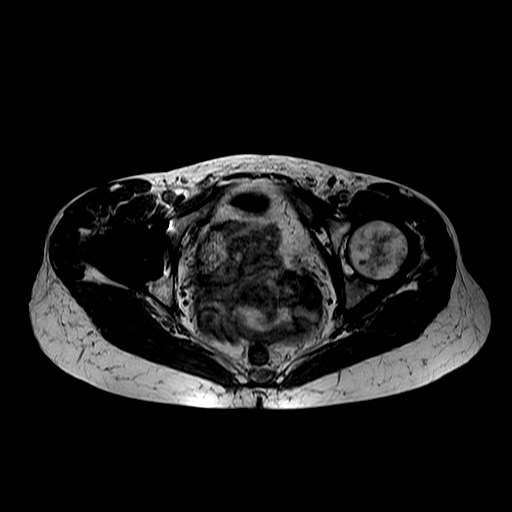
[im 25/30]
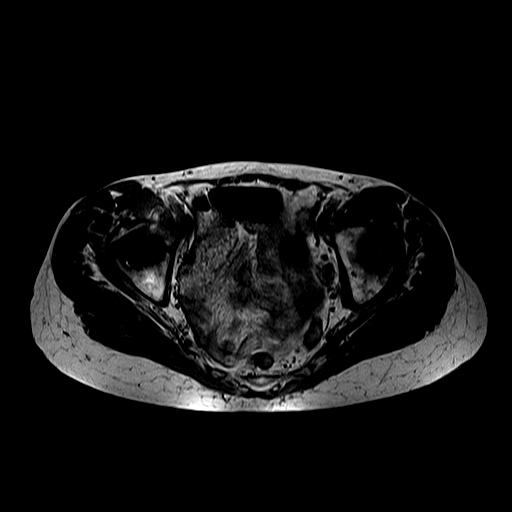

[Series 14: T2 fat-sat · coronal · 4.2mm · 0.70mm/px · 6 of 24 slices shown (2 of 3)]
[im 1/24]
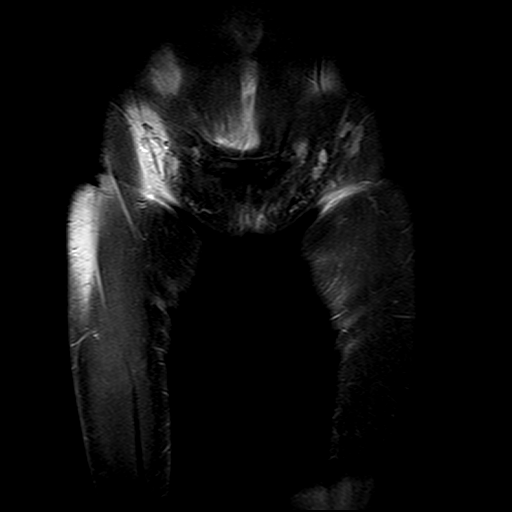
[im 5/24]
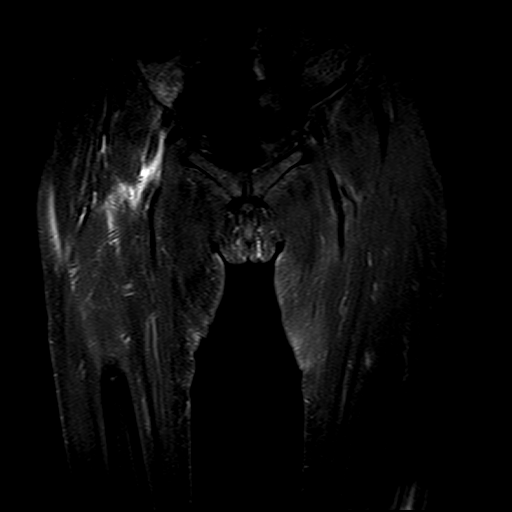
[im 10/24]
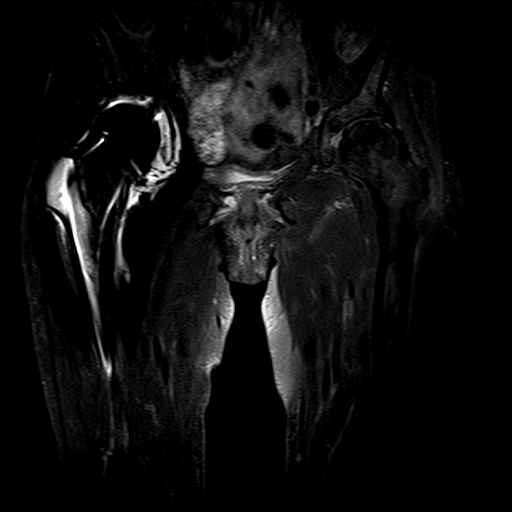
[im 14/24]
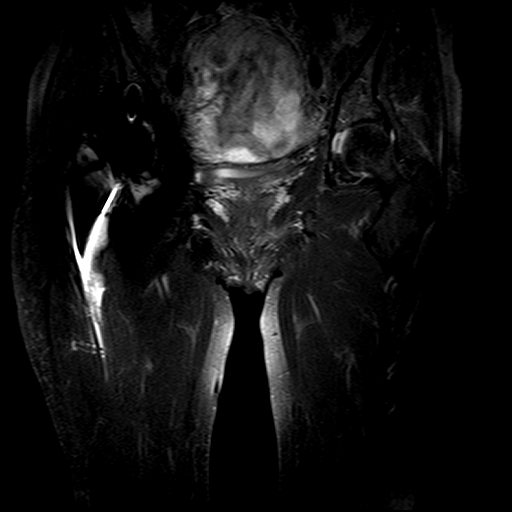
[im 19/24]
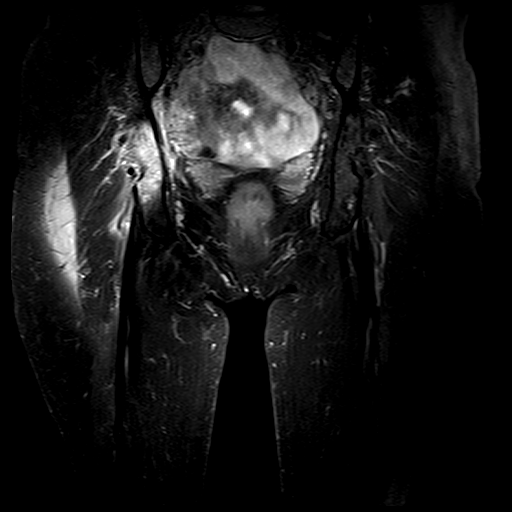
[im 24/24]
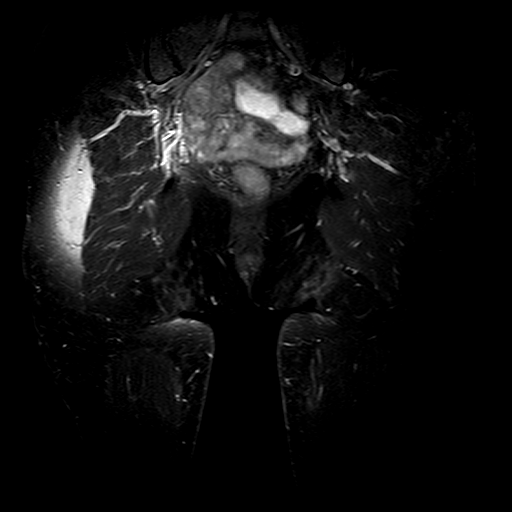

[Series 16: T2 fat-sat · sagittal · 4.5mm · 0.94mm/px · 6 of 24 slices shown (3 of 3)]
[im 1/24]
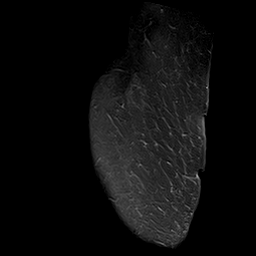
[im 5/24]
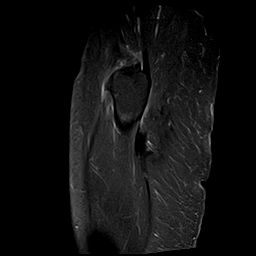
[im 10/24]
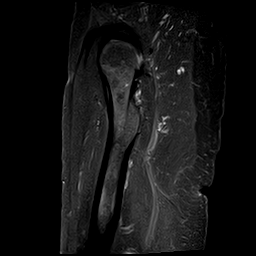
[im 14/24]
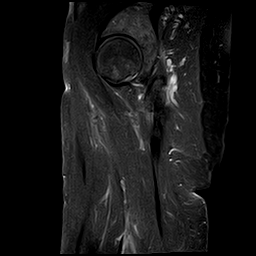
[im 19/24]
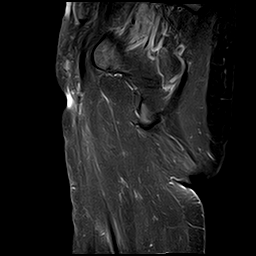
[im 24/24]
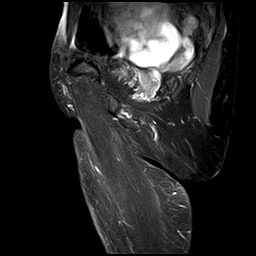

[26 of 40 positions shown; findings below may reference images not displayed]

FINDINGS: Post-operative changes of total hip arthroplasty device are noted in place on the right side.  

No acute bony lesions are seen at the left hip.  No MRI evidence of avascular necrosis.  Mild osteoarthritis of the left hip joint is noted with mild degenerative changes of the acetabular labrum.  No evidence of labral tear.   Soft tissues are unremarkable.
IMPRESSION: 1. Mild osteoarthritis of left hip with mild degenerative changes of the acetabular labrum.  No acute abnormalities are seen at the left hip including avascular necrosis. 

2. Post-operative changes of total hip arthroplasty on the right side.  

3. Soft tissues of the left hip are unremarkable.

## 2022-03-06 ENCOUNTER — Other Ambulatory Visit (INDEPENDENT_AMBULATORY_CARE_PROVIDER_SITE_OTHER): Payer: Self-pay | Admitting: Internal Medicine

## 2022-03-27 ENCOUNTER — Other Ambulatory Visit (RURAL_HEALTH_CENTER): Payer: Self-pay | Admitting: Internal Medicine

## 2022-04-06 ENCOUNTER — Other Ambulatory Visit (INDEPENDENT_AMBULATORY_CARE_PROVIDER_SITE_OTHER): Payer: Self-pay | Admitting: Internal Medicine

## 2022-04-09 ENCOUNTER — Telehealth (INDEPENDENT_AMBULATORY_CARE_PROVIDER_SITE_OTHER): Payer: Self-pay | Admitting: Internal Medicine

## 2022-04-09 ENCOUNTER — Other Ambulatory Visit (RURAL_HEALTH_CENTER): Payer: Self-pay | Admitting: Internal Medicine

## 2022-04-09 MED ORDER — CIPROFLOXACIN 0.3 % EYE DROPS
2.0000 [drp] | Freq: Three times a day (TID) | OPHTHALMIC | 1 refills | Status: DC
Start: 2022-04-09 — End: 2022-08-19

## 2022-04-09 MED ORDER — ONDANSETRON 4 MG DISINTEGRATING TABLET
4.0000 mg | ORAL_TABLET | Freq: Three times a day (TID) | ORAL | 0 refills | Status: AC | PRN
Start: 2022-04-09 — End: ?

## 2022-04-09 MED ORDER — DOXYCYCLINE HYCLATE 100 MG TABLET
100.0000 mg | ORAL_TABLET | Freq: Two times a day (BID) | ORAL | 0 refills | Status: AC
Start: 2022-04-09 — End: 2022-04-19

## 2022-04-09 MED ORDER — FLUCONAZOLE 100 MG TABLET
100.0000 mg | ORAL_TABLET | Freq: Every day | ORAL | 2 refills | Status: DC
Start: 2022-04-09 — End: 2022-08-19

## 2022-04-18 ENCOUNTER — Encounter (INDEPENDENT_AMBULATORY_CARE_PROVIDER_SITE_OTHER): Payer: Self-pay | Admitting: Internal Medicine

## 2022-04-25 IMAGING — US ABD COMPLETE
1 series · 14 of 25 positions shown · non-contrast
Comparison: Ultrasound report dated 08/10/2013.

﻿EXAM: BILLIOT PROFESSIONAL READ ABD U/S COMPLETE
INDICATION: I85.0.

[Series 1: abd complete · 14 of 69 slices shown]
[im 1/69]
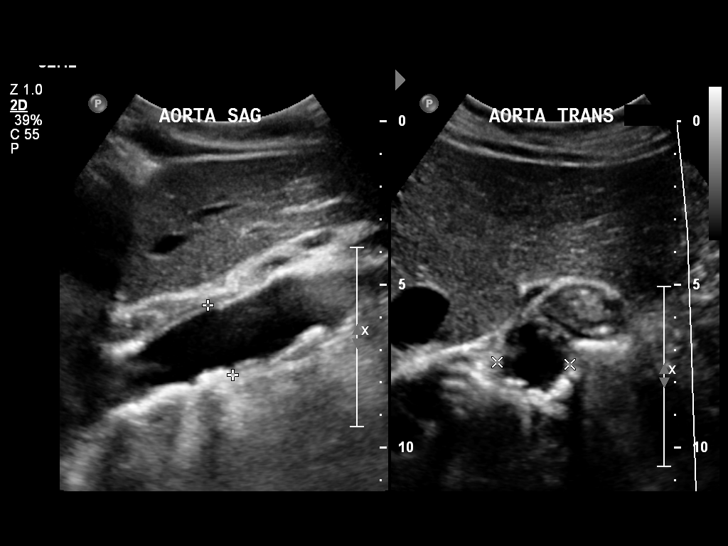
[im 6/69]
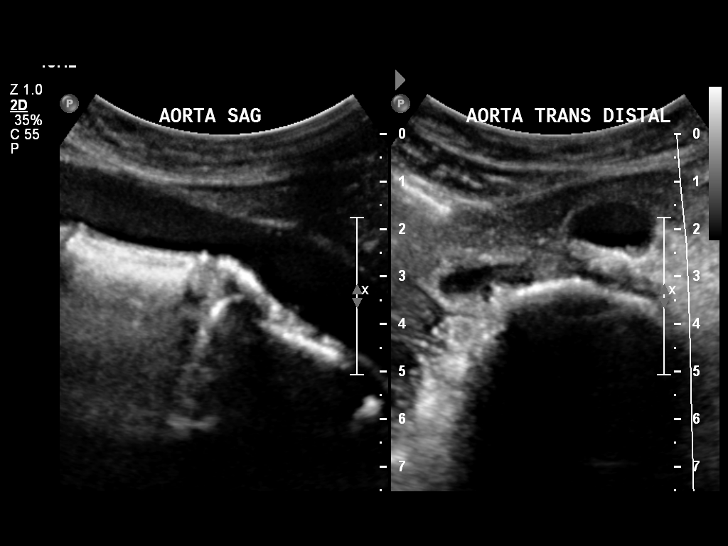
[im 12/69]
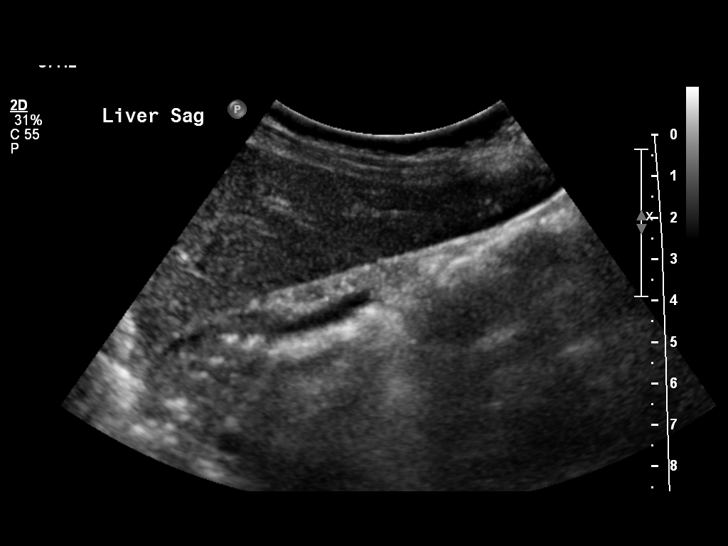
[im 18/69]
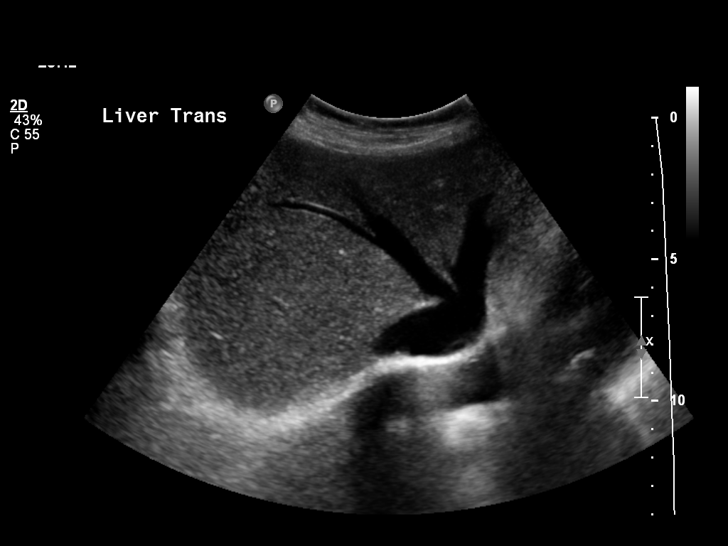
[im 23/69]
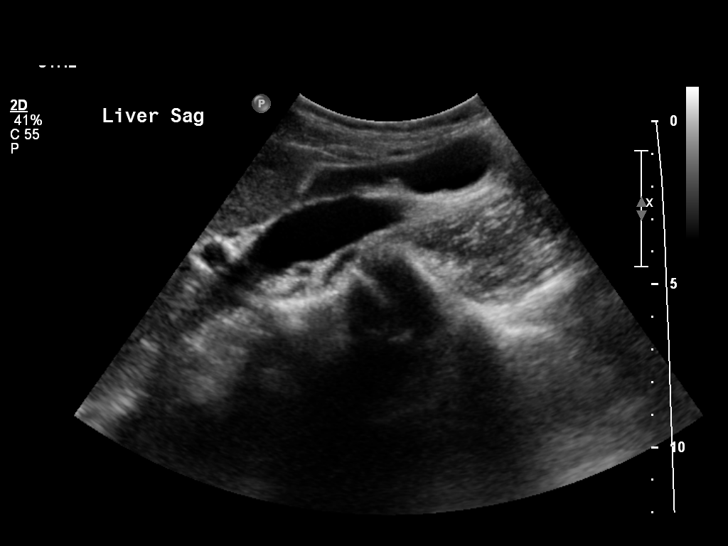
[im 26/69]
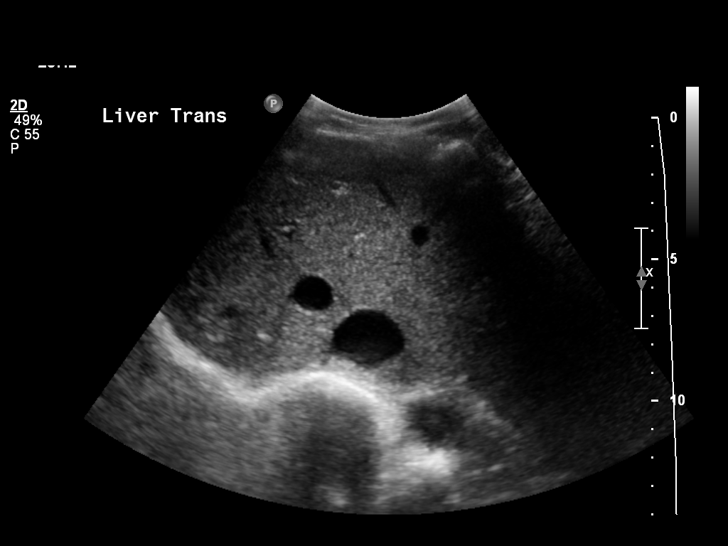
[im 32/69]
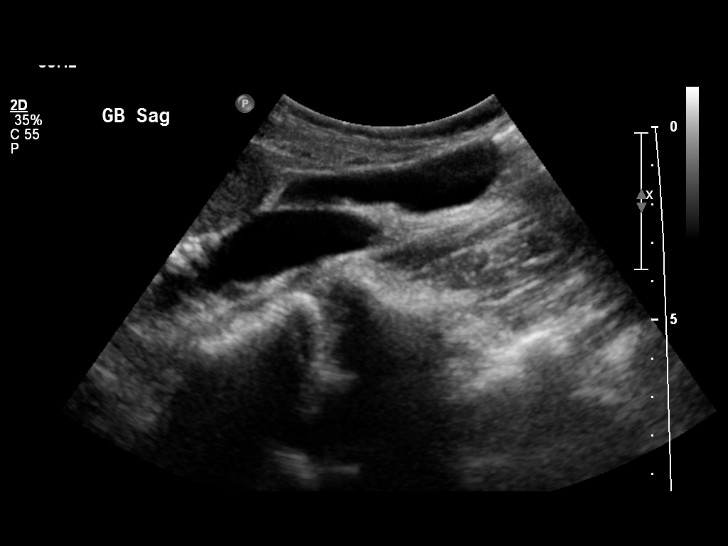
[im 37/69]
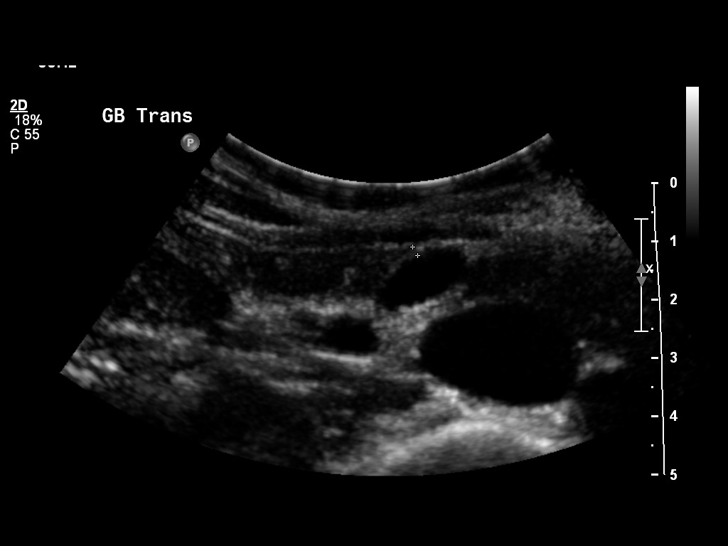
[im 43/69]
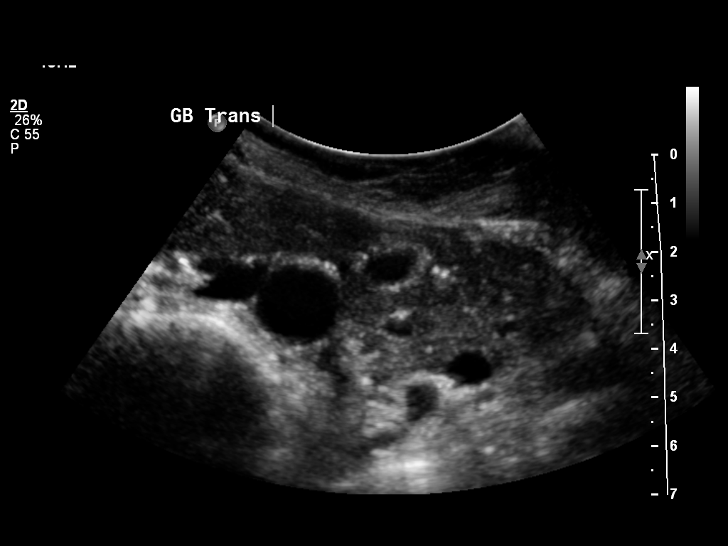
[im 46/69]
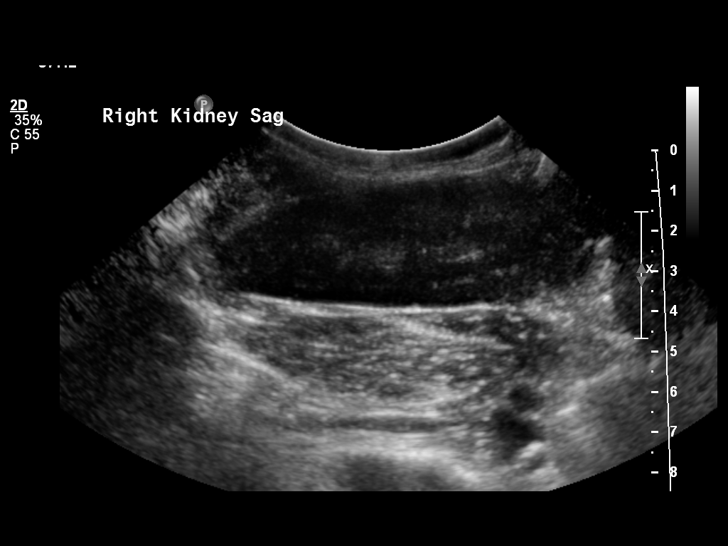
[im 52/69]
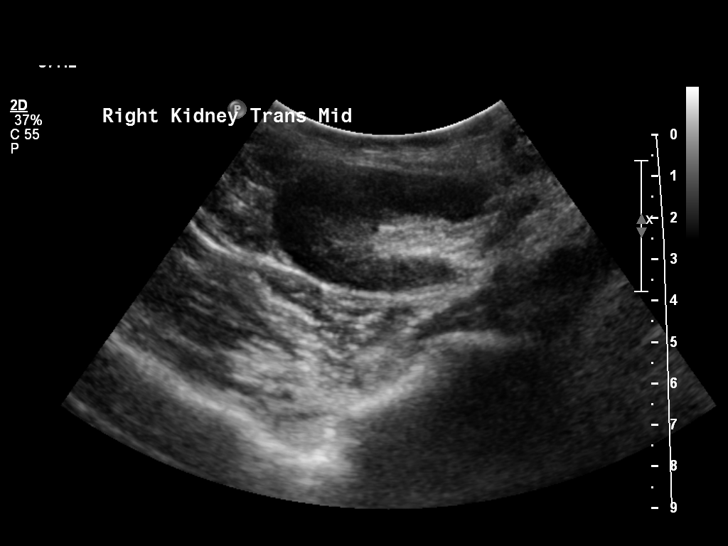
[im 57/69]
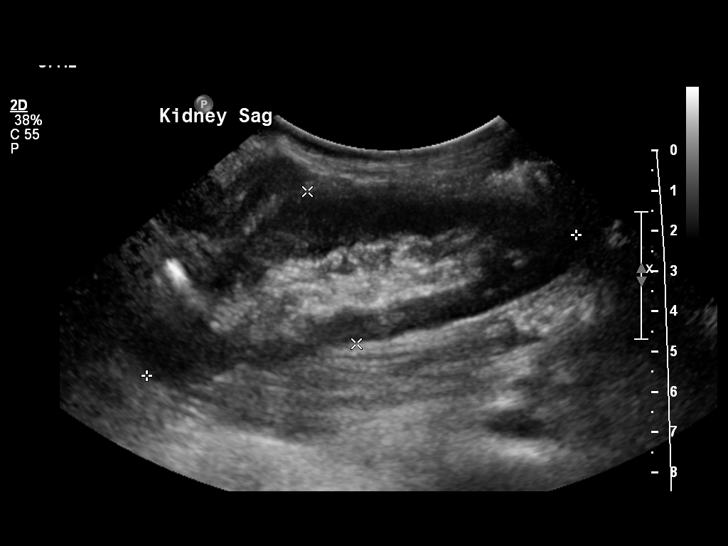
[im 63/69]
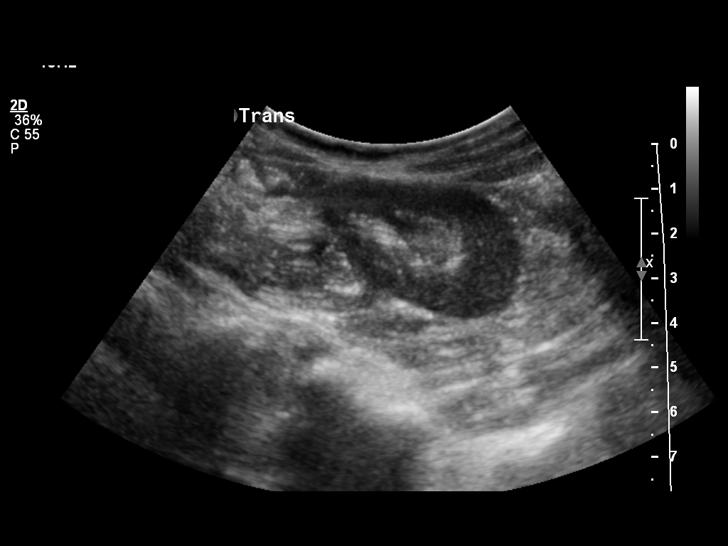
[im 69/69]
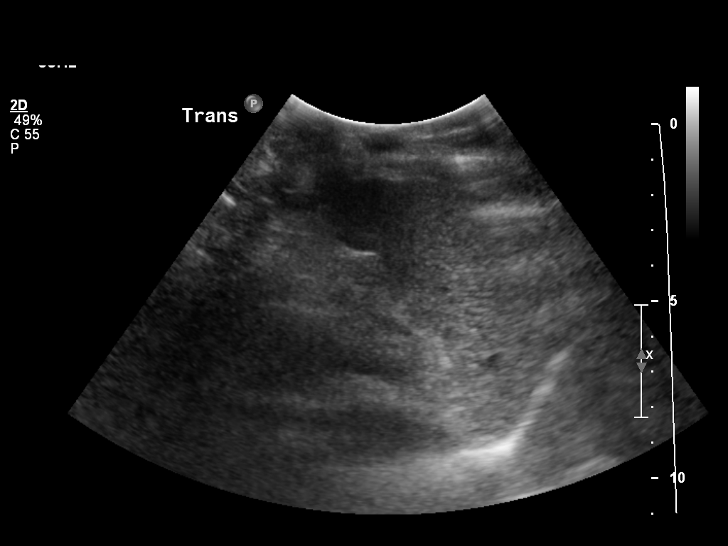

[14 of 25 positions shown; findings below may reference images not displayed]

FINDINGS: Liver is normal in echogenicity. There is no hepatic mass. There is no intra or extrahepatic biliary ductal dilation. Common bile duct measures 1.5 mm. No sludge or shadowing gallstones are seen. There is no gallbladder wall thickening or pericholecystic fluid. Pancreas is incompletely visualized due to artifact from overlying bowel gas. Spleen measures 8.5 cm and is unremarkable.

Kidneys are normal in echogenicity and measure 10.5 cm bilaterally. There is no hydronephrosis, mass or cyst on either side.

Visualized abdominal aorta is without aneurysmal dilatation. IVC is normal. Portal vein measures 6 mm in diameter and demonstrates appropriate hepatopetal direction of flow. Hepatic veins are also patent. There is no ascites.
IMPRESSION: 1. Unremarkable liver.

2. No evidence of cholelithiasis or acute cholecystitis.

3. Pancreas incompletely visualized due to artifact from overlying bowel gas.

## 2022-05-17 ENCOUNTER — Other Ambulatory Visit (INDEPENDENT_AMBULATORY_CARE_PROVIDER_SITE_OTHER): Payer: Self-pay

## 2022-06-07 ENCOUNTER — Encounter (INDEPENDENT_AMBULATORY_CARE_PROVIDER_SITE_OTHER): Payer: Self-pay | Admitting: Internal Medicine

## 2022-06-07 ENCOUNTER — Other Ambulatory Visit (INDEPENDENT_AMBULATORY_CARE_PROVIDER_SITE_OTHER): Payer: Self-pay | Admitting: Internal Medicine

## 2022-06-07 MED ORDER — FAMOTIDINE 40 MG TABLET
40.0000 mg | ORAL_TABLET | Freq: Every evening | ORAL | 2 refills | Status: DC
Start: 2022-06-07 — End: 2022-07-29

## 2022-07-17 ENCOUNTER — Telehealth (INDEPENDENT_AMBULATORY_CARE_PROVIDER_SITE_OTHER): Payer: Self-pay | Admitting: Internal Medicine

## 2022-07-17 DIAGNOSIS — M25559 Pain in unspecified hip: Secondary | ICD-10-CM

## 2022-07-17 DIAGNOSIS — M5430 Sciatica, unspecified side: Secondary | ICD-10-CM

## 2022-07-25 ENCOUNTER — Telehealth (INDEPENDENT_AMBULATORY_CARE_PROVIDER_SITE_OTHER): Payer: Self-pay | Admitting: Internal Medicine

## 2022-07-29 ENCOUNTER — Other Ambulatory Visit: Payer: Self-pay

## 2022-07-29 ENCOUNTER — Encounter (INDEPENDENT_AMBULATORY_CARE_PROVIDER_SITE_OTHER): Payer: Self-pay | Admitting: Internal Medicine

## 2022-07-29 ENCOUNTER — Ambulatory Visit: Payer: Medicare Other | Attending: Internal Medicine | Admitting: Internal Medicine

## 2022-07-29 VITALS — BP 110/60 | HR 80 | Temp 98.6°F | Ht 62.0 in | Wt 101.2 lb

## 2022-07-29 DIAGNOSIS — K5904 Chronic idiopathic constipation: Secondary | ICD-10-CM | POA: Insufficient documentation

## 2022-07-29 DIAGNOSIS — K2270 Barrett's esophagus without dysplasia: Secondary | ICD-10-CM | POA: Insufficient documentation

## 2022-07-29 DIAGNOSIS — K219 Gastro-esophageal reflux disease without esophagitis: Secondary | ICD-10-CM | POA: Insufficient documentation

## 2022-07-29 DIAGNOSIS — Z13820 Encounter for screening for osteoporosis: Secondary | ICD-10-CM | POA: Insufficient documentation

## 2022-07-29 MED ORDER — OMEPRAZOLE 40 MG CAPSULE,DELAYED RELEASE
40.0000 mg | DELAYED_RELEASE_CAPSULE | Freq: Two times a day (BID) | ORAL | 2 refills | Status: DC
Start: 2022-07-29 — End: 2023-07-28

## 2022-07-29 MED ORDER — DICLOFENAC 75 MG-MISOPROSTOL 200 MCG TABLET,IMMEDIATE,DELAYED RELEASE
1.0000 | DELAYED_RELEASE_TABLET | Freq: Two times a day (BID) | ORAL | 5 refills | Status: DC
Start: 2022-07-29 — End: 2022-08-07

## 2022-07-29 MED ORDER — BETHANECHOL CHLORIDE 25 MG TABLET
25.0000 mg | ORAL_TABLET | Freq: Two times a day (BID) | ORAL | 3 refills | Status: DC
Start: 2022-07-29 — End: 2022-08-07

## 2022-07-29 MED ORDER — FAMOTIDINE 40 MG TABLET
40.0000 mg | ORAL_TABLET | Freq: Every evening | ORAL | 2 refills | Status: DC
Start: 2022-07-29 — End: 2023-07-10

## 2022-07-29 MED ORDER — ESTRADIOL 2 MG TABLET
2.0000 mg | ORAL_TABLET | Freq: Every day | ORAL | 2 refills | Status: DC
Start: 2022-07-29 — End: 2023-10-03

## 2022-07-29 MED ORDER — LINACLOTIDE 290 MCG CAPSULE
290.0000 ug | ORAL_CAPSULE | Freq: Every morning | ORAL | 2 refills | Status: AC
Start: 2022-07-29 — End: ?

## 2022-07-29 NOTE — Progress Notes (Signed)
Name: [name]                       Date of Birth: [birth year]   MRN:  [id number]                         Date of visit: 07/29/2022     PCP: Samuella Cota, PA-C     Subjective  [name] is a 72 y.o. year old female who presents for Follow Up 6 Months (Having problems with empty bladder)   to clinic.  No specialty comments available.   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    Edema 05/31/2021    Esophageal reflux 05/31/2021    Hiatal hernia 05/31/2021    IBS (irritable colon syndrome) 05/31/2021    Menopausal flushing 05/31/2021    Left carotid bruit 05/31/2021    Hypomagnesemia 05/31/2021    Vitamin D deficiency 07/03/2016    Osteoporosis 07/03/2016      Current Outpatient Medications   Medication Sig    albuterol sulfate (PROVENTIL OR VENTOLIN OR PROAIR) 90 mcg/actuation Inhalation oral inhaler INHALE TWO PUFFS BY MOUTH FOUR TIMES DAILY    bethanechol chloride (URECHOLINE) 25 mg Oral Tablet Take 1 Tablet (25 mg total) by mouth Twice daily    Black Cohosh 200 mg Oral Capsule Take 1 Capsule (200 mg total) by mouth Once a day    calcium carbonate (TUMS) 200 mg calcium (500 mg) Oral Tablet, Chewable Chew 2 Tablets (1,000 mg total) Once a day    cholecalciferol, vitamin D3, 25 mcg (1,000 unit) Oral Tablet Take 1 Tablet (1,000 Units total) by mouth Once a day    ciprofloxacin HCl (CILOXIN) 0.3 % Ophthalmic Drops Administer 2 Drops into the left ear Three times a day (Patient not taking: Reported on 07/29/2022)    diclofenac-MiSOPROStol (ARTHROTEC 75) 75-200 mg-mcg Oral Tab,IR & Delay Rel,Multiphasic Take 1 Tablet by mouth Twice daily    estradioL (ESTRACE) 2 mg Oral Tablet Take 1 Tablet (2 mg total) by mouth Once a day    famotidine (PEPCID) 40 mg Oral Tablet Take 1  Tablet (40 mg total) by mouth Every evening    fluconazole (DIFLUCAN) 100 mg Oral Tablet Take 1 Tablet (100 mg total) by mouth Once a day (Patient not taking: Reported on 07/29/2022)    ipratropium bromide (ATROVENT) 42 mcg (0.06 %) Nasal Spray, Non-Aerosol Administer 2 Sprays into affected nostril(s) Three times a day    lactobacillus combination no.4 (PROBIOTIC) 3 billion cell Oral Capsule Take by mouth    linaCLOtide (LINZESS) 290 mcg Oral Capsule Take 1 Capsule (290 mcg total) by mouth Every morning    Magnesium Oxide 500 mg Oral Capsule Take 1 Capsule (500 mg total) by mouth Once a day    omeprazole (PRILOSEC) 40 mg Oral Capsule, Delayed Release(E.C.) Take 1 Capsule (40 mg total) by mouth Twice daily    ondansetron (ZOFRAN ODT) 4 mg Oral Tablet, Rapid Dissolve Take 1 Tablet (4 mg total) by mouth Every 8 hours as needed for Nausea/Vomiting        Chronic Disease Management-AMB  Follow up visit  This  72 yo is here for routine  fu   Co  urinary retention   pt  says she has to stand up  sit down up to   3- 5 times  esp at night    Increase in  water  add Miralax      co yeast infection on and off  diflucan discussed and   acidophilus    co dysphagia  pt has had egd in past  and dilatation  and feels she needs it again   ( pt was due in  2020)      follow up asthma    follow up   Barrett's esophagus  last  endoscopy   2020  Dr  Allena Katz  pt is not happy it  took   about 4 mo to get results  re bx  Pt  had to ask about the Barretts  dx and she said whomever she  saw  was rude    She  says she  read about this  dz and  she does not know if she wants  to wait for   3 yr repeat    will  review egd and bx when available.     Meds discussed   Pt  co occ  dysphagia  .       follow up  chronic idiopathic constipation  pt is doing linzess  and stool softners    colon cleanse   and      follow up constipation  still sluggish       pt is having  dental work and has been on  ab  and  needs diflucan prn    follow up   gastroesophageal reflux disease on ppi and stable    hiatal hernia  irritable bowel syndrome    menopausal flushing    pt  was  on premarin and  it was  ok  but cost  was high   pt  now on  estrodiol and  has hot flashes and she increased medication on own to            REVIEW OF SYSTEMS:   Review of Systems  General: No fever.  No chills.  No weight changes.  HEENT: No vision changes.  Cardiac: No chest pain. No palpitations.  No dizziness.  No light-headedness.  No near syncope.  Resp: No dyspnea at rest, no dyspnea on exertion; no cough or hemoptysis; no orthopnea or PND.  GI: No N/V. No melena.  No bright red blood per rectum.  Ext: No edema.  No claudication.  Neuro: No focal weakness.  No numbness.  All other ROS negative.      Objective:   BP 110/60 (Site: Left, Patient Position: Sitting)   Pulse 80   Temp 37 C (98.6 F)   Ht 1.575 m (5\' 2" )   Wt 45.9 kg (101 lb 3.2 oz)   SpO2 98%   BMI 18.51 kg/m              PHYSICAL EXAM  Physical Exam  Gen: NAD. Alert.   HEENT: PERRL; conjunctivae clear. No JVD or carotid bruit.  Cardiac: RRR with normal S1, S2.   Lungs: Clear to auscultation bilaterally. No rales. No wheezing. No rhonchi.  Abdomen: Soft, non-tender.non-distended  nl bowel sounds    Extremities: No edema. No cyanosis. No clubbing.  Neurologic:  Grossly intact    Lab Results   Component Value Date    CHOLESTEROL 218 (H) 02/18/2022    HDLCHOL 98 (H) 02/18/2022    LDLCHOL 87 02/18/2022  TRIG 167 (H) 02/18/2022       COMPLETE BLOOD COUNT   Lab Results   Component Value Date    WBC 6.4 02/18/2022    HGB 12.6 02/18/2022    HCT 38.3 02/18/2022    PLTCNT 309 02/18/2022       DIFFERENTIAL  Lab Results   Component Value Date    PMNS 66 02/18/2022    LYMPHOCYTES 22 02/18/2022    MONOCYTES 8 02/18/2022    EOSINOPHIL 3 02/18/2022    BASOPHILS 1 02/18/2022    BASOPHILS 0.00 02/18/2022    PMNABS 4.20 02/18/2022    LYMPHSABS 1.40 02/18/2022    EOSABS 0.20 02/18/2022    MONOSABS 0.50 02/18/2022         COMPREHENSIVE METABOLIC PANEL   Lab Results   Component Value Date    SODIUM 140 02/18/2022    POTASSIUM 4.0 02/18/2022    CHLORIDE 107 02/18/2022    CO2 29 02/18/2022    ANIONGAP 4 02/18/2022    BUN 20 02/18/2022    CREATININE 0.77 02/18/2022    GLUCOSENF 80 02/18/2022    CALCIUM 8.8 02/18/2022    ALBUMIN 4.1 02/18/2022    TOTALPROTEIN 6.7 02/18/2022    ALKPHOS 46 02/18/2022    AST 22 02/18/2022    ALT 16 02/18/2022            THYROID STIMULATING HORMONE  Lab Results   Component Value Date    TSH 2.837 02/18/2022        No results found for: "HA1C"  Lab Results   Component Value Date    VITD 36 02/18/2022         Assessment/Plan  Problem List Items Addressed This Visit    None       Orders Placed This Encounter    estradioL (ESTRACE) 2 mg Oral Tablet    famotidine (PEPCID) 40 mg Oral Tablet    linaCLOtide (LINZESS) 290 mcg Oral Capsule    omeprazole (PRILOSEC) 40 mg Oral Capsule, Delayed Release(E.C.)    bethanechol chloride (URECHOLINE) 25 mg Oral Tablet    diclofenac-MiSOPROStol (ARTHROTEC 75) 75-200 mg-mcg Oral Tab,IR & Delay Rel,Multiphasic        Meds reviewed as well as labs.  Chart reviewed and updated.   Continue current treatment.  Keep follow-up appointment.   Vaccine hx reviewed.   Miralax add   Change bethanechol   to  25 bid  monitor retention     Stop Mobic  try  voltaran combo   Aug  eye exam   due  yearly     Labs  reviewed    93mo  fu  meds

## 2022-07-30 ENCOUNTER — Telehealth (INDEPENDENT_AMBULATORY_CARE_PROVIDER_SITE_OTHER): Payer: Self-pay | Admitting: Internal Medicine

## 2022-08-07 ENCOUNTER — Telehealth (INDEPENDENT_AMBULATORY_CARE_PROVIDER_SITE_OTHER): Payer: Self-pay | Admitting: Internal Medicine

## 2022-08-07 MED ORDER — MELOXICAM 15 MG TABLET
15.0000 mg | ORAL_TABLET | Freq: Every day | ORAL | 3 refills | Status: DC
Start: 2022-08-07 — End: 2023-01-02

## 2022-08-07 MED ORDER — BETHANECHOL CHLORIDE 5 MG TABLET
5.0000 mg | ORAL_TABLET | Freq: Three times a day (TID) | ORAL | 2 refills | Status: DC
Start: 2022-08-07 — End: 2022-08-14

## 2022-08-14 ENCOUNTER — Ambulatory Visit: Payer: Medicare Other | Attending: Internal Medicine | Admitting: Internal Medicine

## 2022-08-14 ENCOUNTER — Encounter (INDEPENDENT_AMBULATORY_CARE_PROVIDER_SITE_OTHER): Payer: Self-pay | Admitting: Internal Medicine

## 2022-08-14 ENCOUNTER — Other Ambulatory Visit: Payer: Self-pay

## 2022-08-14 VITALS — HR 71 | Temp 98.2°F

## 2022-08-14 DIAGNOSIS — R21 Rash and other nonspecific skin eruption: Secondary | ICD-10-CM

## 2022-08-14 MED ORDER — NYSTATIN-TRIAMCINOLONE 100,000 UNIT/GRAM-0.1 % TOPICAL OINTMENT
TOPICAL_OINTMENT | Freq: Two times a day (BID) | CUTANEOUS | 2 refills | Status: AC
Start: 2022-08-14 — End: 2022-08-28

## 2022-08-14 MED ORDER — VALACYCLOVIR 1 GRAM TABLET
1000.0000 mg | ORAL_TABLET | Freq: Three times a day (TID) | ORAL | 0 refills | Status: DC
Start: 2022-08-14 — End: 2023-01-01

## 2022-08-14 NOTE — Nursing Note (Signed)
Rash itching burning on bikini area she has tried different things nothing really helping  States now it looks like blisters

## 2022-08-14 NOTE — Progress Notes (Signed)
When she does INTERNAL MEDICINE, BUILDING A  [address]  [place] [address]  Operated by [facility]  Progress Note    Name: Arshia Nunley MRN:  [id number]   Date: 08/14/2022 DOB:  1950-04-04 (72 y.o.)              Chief Complaint: Rash (Rash itching burning on bikini area she has tried different things nothing really helping/States now it looks like blisters)       HPI: Deija Zupko is a 72 y.o. female who complains of   1 week  co rash  lower   abdomen    corisone  tried   and no help  than lotrimin   tried     than  monistat    has helped  some         Allergies:  Allergies   Allergen Reactions    Sulfa (Sulfonamides) Itching       albuterol sulfate (PROVENTIL OR VENTOLIN OR PROAIR) 90 mcg/actuation Inhalation oral inhaler, INHALE TWO PUFFS BY MOUTH FOUR TIMES DAILY  Black Cohosh 200 mg Oral Capsule, Take 1 Capsule (200 mg total) by mouth Once a day  calcium carbonate (TUMS) 200 mg calcium (500 mg) Oral Tablet, Chewable, Chew 2 Tablets (1,000 mg total) Once a day  cholecalciferol, vitamin D3, 25 mcg (1,000 unit) Oral Tablet, Take 1 Tablet (1,000 Units total) by mouth Once a day  estradioL (ESTRACE) 2 mg Oral Tablet, Take 1 Tablet (2 mg total) by mouth Once a day  famotidine (PEPCID) 40 mg Oral Tablet, Take 1 Tablet (40 mg total) by mouth Every evening  ipratropium bromide (ATROVENT) 42 mcg (0.06 %) Nasal Spray, Non-Aerosol, Administer 2 Sprays into affected nostril(s) Three times a day  lactobacillus combination no.4 (PROBIOTIC) 3 billion cell Oral Capsule, Take by mouth  linaCLOtide (LINZESS) 290 mcg Oral Capsule, Take 1 Capsule (290 mcg total) by mouth Every morning  Magnesium Oxide 500 mg Oral Capsule, Take 1 Capsule (500 mg total) by mouth Once a day  meloxicam (MOBIC) 15 mg Oral Tablet, Take 1 Tablet (15 mg total) by mouth Once a day  omeprazole (PRILOSEC) 40 mg Oral Capsule, Delayed Release(E.C.), Take 1 Capsule (40 mg total) by mouth Twice daily  ondansetron (ZOFRAN  ODT) 4 mg Oral Tablet, Rapid Dissolve, Take 1 Tablet (4 mg total) by mouth Every 8 hours as needed for Nausea/Vomiting  bethanechol chloride (URECHOLINE) 5 mg Oral Tablet, Take 1 Tablet (5 mg total) by mouth Three times a day (Patient taking differently: Take 1 Tablet (5 mg total) by mouth Twice daily)  ciprofloxacin HCl (CILOXIN) 0.3 % Ophthalmic Drops, Administer 2 Drops into the left ear Three times a day (Patient not taking: Reported on 07/29/2022)  fluconazole (DIFLUCAN) 100 mg Oral Tablet, Take 1 Tablet (100 mg total) by mouth Once a day (Patient not taking: Reported on 07/29/2022)    No facility-administered medications prior to visit.       Review of Systems -   ROS  Const  Reports system reviewed and no additional complaints, except as documented  ENT  Reports system reviewed and no additional complaints, except as documented  Resp  Reports system reviewed and no additional complaints, except as documented  GI  Reports system reviewed and no additional complaints, except as documented     OBJECTIVE:  Vitals:    08/14/22 1146   Pulse: 71   Temp: 36.8 C (98.2 F)   SpO2: 97%  Physical Exam   Alert and oriented  x 3     Macular papular rash clustered  left  lower abdomen and above  pubis bone     Shingles vs  multiple  bites       ASSESSMENT:     ICD-10-CM    1. Atypical rash  R21           Orders Placed This Encounter    valACYclovir (VALTREX) 1 gram Oral Tablet    nystatin-triamcinolone (MYCOLOG) 100,000-0.1 unit/gram-% Ointment        PLAN: Treatment per orders . Call or return to clinic prn if these symptoms worsen or fail to improve as anticipated.  Meds reviewed as well as labs.  Chart reviewed and updated.   Will cover  for shingles   valtrex   1 gram  tid   and   topical with  nystatin  and  triamcinolone  oint     Samuella Cota, PA-C

## 2022-08-19 ENCOUNTER — Encounter (INDEPENDENT_AMBULATORY_CARE_PROVIDER_SITE_OTHER): Payer: Self-pay | Admitting: Internal Medicine

## 2022-08-20 ENCOUNTER — Ambulatory Visit (INDEPENDENT_AMBULATORY_CARE_PROVIDER_SITE_OTHER): Payer: Self-pay | Admitting: Internal Medicine

## 2022-09-17 ENCOUNTER — Other Ambulatory Visit (HOSPITAL_COMMUNITY): Payer: Self-pay | Admitting: Orthopaedic Surgery

## 2022-09-17 DIAGNOSIS — M25561 Pain in right knee: Secondary | ICD-10-CM

## 2022-09-17 DIAGNOSIS — S83206A Unspecified tear of unspecified meniscus, current injury, right knee, initial encounter: Secondary | ICD-10-CM

## 2022-10-07 ENCOUNTER — Other Ambulatory Visit: Payer: Self-pay

## 2022-10-07 ENCOUNTER — Inpatient Hospital Stay
Admission: RE | Admit: 2022-10-07 | Discharge: 2022-10-07 | Disposition: A | Discharge status: Home / Self Care | Payer: Medicare Other | Source: Ambulatory Visit | Attending: Orthopaedic Surgery

## 2022-10-07 DIAGNOSIS — S83206A Unspecified tear of unspecified meniscus, current injury, right knee, initial encounter: Secondary | ICD-10-CM | POA: Insufficient documentation

## 2022-10-07 DIAGNOSIS — M25561 Pain in right knee: Secondary | ICD-10-CM | POA: Insufficient documentation

## 2022-10-10 ENCOUNTER — Telehealth (INDEPENDENT_AMBULATORY_CARE_PROVIDER_SITE_OTHER): Payer: Self-pay | Admitting: Internal Medicine

## 2022-10-10 MED ORDER — FLUCONAZOLE 150 MG TABLET
150.0000 mg | ORAL_TABLET | Freq: Once | ORAL | 0 refills | Status: AC
Start: 2022-10-10 — End: 2022-10-10

## 2022-10-10 MED ORDER — CIPROFLOXACIN 500 MG TABLET
500.0000 mg | ORAL_TABLET | Freq: Two times a day (BID) | ORAL | 0 refills | Status: AC
Start: 2022-10-10 — End: 2022-10-17

## 2022-11-05 ENCOUNTER — Ambulatory Visit (INDEPENDENT_AMBULATORY_CARE_PROVIDER_SITE_OTHER): Payer: Self-pay | Admitting: Internal Medicine

## 2022-11-20 ENCOUNTER — Ambulatory Visit (INDEPENDENT_AMBULATORY_CARE_PROVIDER_SITE_OTHER): Payer: Self-pay | Admitting: Internal Medicine

## 2022-11-29 ENCOUNTER — Other Ambulatory Visit (HOSPITAL_COMMUNITY): Payer: Self-pay | Admitting: Orthopaedic Surgery

## 2022-11-29 DIAGNOSIS — M1711 Unilateral primary osteoarthritis, right knee: Secondary | ICD-10-CM

## 2022-12-09 ENCOUNTER — Ambulatory Visit (INDEPENDENT_AMBULATORY_CARE_PROVIDER_SITE_OTHER): Payer: Self-pay | Admitting: Internal Medicine

## 2022-12-16 ENCOUNTER — Other Ambulatory Visit: Payer: Self-pay

## 2022-12-16 ENCOUNTER — Ambulatory Visit: Payer: Medicare Other | Attending: Internal Medicine | Admitting: Internal Medicine

## 2022-12-16 ENCOUNTER — Encounter (INDEPENDENT_AMBULATORY_CARE_PROVIDER_SITE_OTHER): Payer: Self-pay | Admitting: Internal Medicine

## 2022-12-16 DIAGNOSIS — K5904 Chronic idiopathic constipation: Secondary | ICD-10-CM | POA: Insufficient documentation

## 2022-12-16 DIAGNOSIS — Z01818 Encounter for other preprocedural examination: Secondary | ICD-10-CM | POA: Insufficient documentation

## 2022-12-16 DIAGNOSIS — J45909 Unspecified asthma, uncomplicated: Secondary | ICD-10-CM | POA: Insufficient documentation

## 2022-12-16 DIAGNOSIS — M1711 Unilateral primary osteoarthritis, right knee: Secondary | ICD-10-CM | POA: Insufficient documentation

## 2022-12-16 DIAGNOSIS — K219 Gastro-esophageal reflux disease without esophagitis: Secondary | ICD-10-CM | POA: Insufficient documentation

## 2022-12-17 ENCOUNTER — Inpatient Hospital Stay
Admission: RE | Admit: 2022-12-17 | Discharge: 2022-12-17 | Disposition: A | Discharge status: Home / Self Care | Payer: Medicare Other | Source: Ambulatory Visit | Attending: Orthopaedic Surgery | Admitting: Orthopaedic Surgery

## 2022-12-17 DIAGNOSIS — M1711 Unilateral primary osteoarthritis, right knee: Secondary | ICD-10-CM | POA: Insufficient documentation

## 2022-12-20 ENCOUNTER — Other Ambulatory Visit: Payer: Self-pay

## 2022-12-20 ENCOUNTER — Ambulatory Visit: Payer: Medicare Other | Attending: Orthopaedic Surgery

## 2022-12-20 DIAGNOSIS — Z01818 Encounter for other preprocedural examination: Secondary | ICD-10-CM | POA: Insufficient documentation

## 2022-12-20 DIAGNOSIS — M1711 Unilateral primary osteoarthritis, right knee: Secondary | ICD-10-CM | POA: Insufficient documentation

## 2022-12-20 LAB — URINALYSIS, MACROSCOPIC
BILIRUBIN: NEGATIVE mg/dL
BLOOD: 0.03 mg/dL
GLUCOSE: NEGATIVE mg/dL
KETONES: NEGATIVE mg/dL
LEUKOCYTES: NEGATIVE WBCs/uL
NITRITE: NEGATIVE
PH: 6.5 (ref 5.0–9.0)
PROTEIN: NEGATIVE mg/dL
SPECIFIC GRAVITY: 1.02 (ref 1.002–1.030)
UROBILINOGEN: NORMAL mg/dL

## 2022-12-20 LAB — URINALYSIS, MICROSCOPIC
RBCS: 7 /[HPF] — ABNORMAL HIGH (ref <4)
SQUAMOUS EPITHELIAL: 1 /[HPF] (ref <28)
WBCS: <1 /[HPF] (ref <6)

## 2022-12-20 LAB — BASIC METABOLIC PANEL
ANION GAP: 5 mmol/L (ref 4–13)
BUN/CREA RATIO: 39 — ABNORMAL HIGH (ref 6–22)
BUN: 24 mg/dL (ref 7–25)
CALCIUM: 8.4 mg/dL — ABNORMAL LOW (ref 8.6–10.3)
CHLORIDE: 107 mmol/L (ref 98–107)
CO2 TOTAL: 28 mmol/L (ref 21–31)
CREATININE: 0.62 mg/dL (ref 0.60–1.30)
ESTIMATED GFR: 96 mL/min/{1.73_m2} (ref >59)
GLUCOSE: 105 mg/dL (ref 74–109)
OSMOLALITY, CALCULATED: 284 mosm/kg (ref 270–290)
POTASSIUM: 3.7 mmol/L (ref 3.5–5.1)
SODIUM: 140 mmol/L (ref 136–145)

## 2022-12-20 LAB — CBC
HCT: 37.1 % (ref 31.2–41.9)
HGB: 12.1 g/dL (ref 10.9–14.3)
MCH: 31 pg (ref 24.7–32.8)
MCHC: 32.6 g/dL (ref 32.3–35.6)
MCV: 94.9 fL (ref 75.5–95.3)
MPV: 9.7 fL (ref 7.9–10.8)
PLATELETS: 319 10*3/uL (ref 140–440)
RBC: 3.91 10*6/uL (ref 3.63–4.92)
RDW: 12.8 % (ref 12.3–17.7)
WBC: 6.7 10*3/uL (ref 3.8–11.8)

## 2022-12-20 LAB — HGA1C (HEMOGLOBIN A1C WITH EST AVG GLUCOSE): HEMOGLOBIN A1C: 5.3 % (ref 4.0–6.0)

## 2022-12-24 NOTE — Assessment & Plan Note (Signed)
Continue linzess 290 mcg  and  mirlax    Monitor bowel movements

## 2022-12-24 NOTE — Assessment & Plan Note (Signed)
Stable on Prilosec  40 mg and  Pepcid 40 mg one daily

## 2022-12-24 NOTE — Assessment & Plan Note (Signed)
Stable  pt  has not had any recent  issues

## 2022-12-26 NOTE — Addendum Note (Signed)
Addended by: Beecher Mcardle A on: 12/26/2022 03:03 PM     Modules accepted: Orders

## 2022-12-31 ENCOUNTER — Encounter (HOSPITAL_COMMUNITY): Payer: Self-pay | Admitting: Orthopaedic Surgery

## 2023-01-01 ENCOUNTER — Observation Stay
Admission: RE | Admit: 2023-01-01 | Discharge: 2023-01-02 | Disposition: A | Discharge status: Home / Self Care | Payer: Medicare Other | Source: Ambulatory Visit | Attending: Orthopaedic Surgery | Admitting: Orthopaedic Surgery

## 2023-01-01 ENCOUNTER — Inpatient Hospital Stay (HOSPITAL_COMMUNITY): Payer: Medicare Other | Admitting: Anesthesiology

## 2023-01-01 ENCOUNTER — Encounter (HOSPITAL_COMMUNITY)
Admission: RE | Disposition: A | Discharge status: Home / Self Care | Payer: Self-pay | Source: Ambulatory Visit | Attending: Orthopaedic Surgery

## 2023-01-01 ENCOUNTER — Encounter (HOSPITAL_COMMUNITY): Payer: Self-pay | Admitting: Orthopaedic Surgery

## 2023-01-01 ENCOUNTER — Other Ambulatory Visit: Payer: Self-pay

## 2023-01-01 ENCOUNTER — Observation Stay (HOSPITAL_COMMUNITY): Payer: Medicare Other | Admitting: Orthopaedic Surgery

## 2023-01-01 DIAGNOSIS — M1990 Unspecified osteoarthritis, unspecified site: Secondary | ICD-10-CM | POA: Diagnosis present

## 2023-01-01 DIAGNOSIS — J45909 Unspecified asthma, uncomplicated: Secondary | ICD-10-CM | POA: Insufficient documentation

## 2023-01-01 DIAGNOSIS — S83511A Sprain of anterior cruciate ligament of right knee, initial encounter: Secondary | ICD-10-CM | POA: Insufficient documentation

## 2023-01-01 DIAGNOSIS — J8283 Eosinophilic asthma: Secondary | ICD-10-CM | POA: Insufficient documentation

## 2023-01-01 DIAGNOSIS — M1711 Unilateral primary osteoarthritis, right knee: Principal | ICD-10-CM | POA: Insufficient documentation

## 2023-01-01 DIAGNOSIS — Z96651 Presence of right artificial knee joint: Principal | ICD-10-CM

## 2023-01-01 DIAGNOSIS — K219 Gastro-esophageal reflux disease without esophagitis: Secondary | ICD-10-CM | POA: Insufficient documentation

## 2023-01-01 DIAGNOSIS — K5904 Chronic idiopathic constipation: Secondary | ICD-10-CM | POA: Insufficient documentation

## 2023-01-01 DIAGNOSIS — Z85828 Personal history of other malignant neoplasm of skin: Secondary | ICD-10-CM | POA: Insufficient documentation

## 2023-01-01 DIAGNOSIS — K449 Diaphragmatic hernia without obstruction or gangrene: Secondary | ICD-10-CM | POA: Insufficient documentation

## 2023-01-01 HISTORY — DX: Constipation, unspecified: K59.00

## 2023-01-01 HISTORY — DX: Unspecified osteoarthritis, unspecified site: M19.90

## 2023-01-01 HISTORY — DX: Presence of spectacles and contact lenses: Z97.3

## 2023-01-01 HISTORY — DX: Malignant (primary) neoplasm, unspecified: C80.1

## 2023-01-01 HISTORY — DX: Spontaneous ecchymoses: R23.3

## 2023-01-01 SURGERY — ARTHOPLASTY KNEE TOTAL MAKO ROBOTIC ASSISTED
Anesthesia: General | Site: Knee | Laterality: Right | Wound class: Clean Wound: Uninfected operative wounds in which no inflammation occurred

## 2023-01-01 MED ORDER — DEXAMETHASONE SODIUM PHOSPHATE (PF) 10 MG/ML INJECTION SOLUTION
8.0000 mg | Freq: Two times a day (BID) | INTRAMUSCULAR | Status: AC
Start: 2023-01-01 — End: 2023-01-02
  Administered 2023-01-01 – 2023-01-02 (×2): 8 mg via INTRAVENOUS
  Filled 2023-01-01 (×2): qty 1

## 2023-01-01 MED ORDER — APREPITANT 40 MG CAPSULE
40.0000 mg | ORAL_CAPSULE | Freq: Once | ORAL | Status: AC
Start: 2023-01-01 — End: 2023-01-01
  Administered 2023-01-01: 40 mg via ORAL

## 2023-01-01 MED ORDER — FAMOTIDINE 20 MG TABLET
20.0000 mg | ORAL_TABLET | Freq: Two times a day (BID) | ORAL | Status: DC
Start: 2023-01-01 — End: 2023-01-02
  Administered 2023-01-01 – 2023-01-02 (×3): 20 mg via ORAL
  Filled 2023-01-01 (×3): qty 1

## 2023-01-01 MED ORDER — SUGAMMADEX 100 MG/ML INTRAVENOUS SOLUTION
Freq: Once | INTRAVENOUS | Status: DC | PRN
Start: 2023-01-01 — End: 2023-01-01
  Administered 2023-01-01: 400 mg via INTRAVENOUS

## 2023-01-01 MED ORDER — SODIUM CHLORIDE 0.9 % INTRAVENOUS PIGGYBACK
INJECTION | INTRAVENOUS | Status: AC
Start: 2023-01-01 — End: 2023-01-01
  Filled 2023-01-01: qty 100

## 2023-01-01 MED ORDER — CEFAZOLIN 2 GRAM INTRAVENOUS SOLUTION
INTRAVENOUS | Status: AC
Start: 2023-01-01 — End: 2023-01-01
  Filled 2023-01-01: qty 14.71

## 2023-01-01 MED ORDER — FENTANYL (PF) 50 MCG/ML INJECTION SOLUTION
INTRAMUSCULAR | Status: AC
Start: 2023-01-01 — End: 2023-01-01
  Filled 2023-01-01: qty 2

## 2023-01-01 MED ORDER — ACETAMINOPHEN 325 MG TABLET
ORAL_TABLET | ORAL | Status: AC
Start: 2023-01-01 — End: 2023-01-01
  Filled 2023-01-01: qty 3

## 2023-01-01 MED ORDER — CHOLECALCIFEROL (VITAMIN D3) 25 MCG (1,000 UNIT) TABLET
1000.0000 [IU] | ORAL_TABLET | Freq: Every day | ORAL | Status: DC
Start: 2023-01-01 — End: 2023-01-02
  Administered 2023-01-01 – 2023-01-02 (×2): 1000 [IU] via ORAL
  Filled 2023-01-01 (×2): qty 1

## 2023-01-01 MED ORDER — PROCHLORPERAZINE EDISYLATE 10 MG/2 ML (5 MG/ML) INJECTION SOLUTION
5.0000 mg | Freq: Once | INTRAMUSCULAR | Status: DC | PRN
Start: 2023-01-01 — End: 2023-01-01

## 2023-01-01 MED ORDER — EPHEDRINE SULFATE 50 MG/ML INTRAVENOUS SOLUTION
Freq: Once | INTRAVENOUS | Status: DC | PRN
Start: 2023-01-01 — End: 2023-01-01
  Administered 2023-01-01 (×2): 10 mg via INTRAVENOUS

## 2023-01-01 MED ORDER — NAPROXEN 250 MG TABLET
500.0000 mg | ORAL_TABLET | Freq: Once | ORAL | Status: AC
Start: 2023-01-01 — End: 2023-01-01
  Administered 2023-01-01: 500 mg via ORAL

## 2023-01-01 MED ORDER — MIDAZOLAM 5 MG/ML INJECTION WRAPPER
INTRAMUSCULAR | Status: AC
Start: 2023-01-01 — End: 2023-01-01
  Filled 2023-01-01: qty 1

## 2023-01-01 MED ORDER — NAPROXEN 250 MG TABLET
ORAL_TABLET | ORAL | Status: AC
Start: 2023-01-01 — End: 2023-01-01
  Filled 2023-01-01: qty 2

## 2023-01-01 MED ORDER — FENTANYL (PF) 50 MCG/ML INJECTION WRAPPER
INJECTION | Freq: Once | INTRAMUSCULAR | Status: DC | PRN
Start: 2023-01-01 — End: 2023-01-01
  Administered 2023-01-01 (×2): 50 ug via INTRAVENOUS

## 2023-01-01 MED ORDER — DOCUSATE SODIUM 100 MG CAPSULE
100.0000 mg | ORAL_CAPSULE | Freq: Two times a day (BID) | ORAL | Status: DC
Start: 2023-01-01 — End: 2023-01-02
  Administered 2023-01-01 – 2023-01-02 (×3): 100 mg via ORAL
  Filled 2023-01-01 (×3): qty 1

## 2023-01-01 MED ORDER — SODIUM CHLORIDE 0.9 % INTRAVENOUS PIGGYBACK
2.0000 g | Freq: Once | INTRAVENOUS | Status: AC
Start: 2023-01-01 — End: 2023-01-01
  Administered 2023-01-01: 2 g via INTRAVENOUS

## 2023-01-01 MED ORDER — ONDANSETRON HCL (PF) 4 MG/2 ML INJECTION SOLUTION
4.0000 mg | Freq: Once | INTRAMUSCULAR | Status: DC | PRN
Start: 2023-01-01 — End: 2023-01-01

## 2023-01-01 MED ORDER — ALBUTEROL SULFATE 2.5 MG/3 ML (0.083 %) SOLUTION FOR NEBULIZATION
2.5000 mg | INHALATION_SOLUTION | Freq: Once | RESPIRATORY_TRACT | Status: DC | PRN
Start: 2023-01-01 — End: 2023-01-01

## 2023-01-01 MED ORDER — SODIUM CHLORIDE 0.9 % (FLUSH) INJECTION SYRINGE
3.0000 mL | INJECTION | Freq: Three times a day (TID) | INTRAMUSCULAR | Status: DC
Start: 2023-01-01 — End: 2023-01-01

## 2023-01-01 MED ORDER — ONDANSETRON HCL (PF) 4 MG/2 ML INJECTION SOLUTION
4.0000 mg | INTRAMUSCULAR | Status: DC | PRN
Start: 2023-01-01 — End: 2023-01-02

## 2023-01-01 MED ORDER — ONDANSETRON HCL (PF) 4 MG/2 ML INJECTION SOLUTION
INTRAMUSCULAR | Status: AC
Start: 2023-01-01 — End: 2023-01-01
  Filled 2023-01-01: qty 4

## 2023-01-01 MED ORDER — ROPIVACAINE (PF) 2 MG/ML (0.2 %) INJECTION SOLUTION
Freq: Once | INTRAMUSCULAR | Status: DC | PRN
Start: 2023-01-01 — End: 2023-01-01
  Administered 2023-01-01: 30 mL via INTRA_ARTICULAR

## 2023-01-01 MED ORDER — BISACODYL 10 MG RECTAL SUPPOSITORY
10.0000 mg | Freq: Every day | RECTAL | Status: DC | PRN
Start: 2023-01-01 — End: 2023-01-02

## 2023-01-01 MED ORDER — SODIUM CHLORIDE 0.9 % INTRAVENOUS SOLUTION
INTRAVENOUS | Status: DC
Start: 2023-01-01 — End: 2023-01-01
  Administered 2023-01-01: 0 mL via INTRAVENOUS

## 2023-01-01 MED ORDER — SODIUM CHLORIDE 0.9 % (FLUSH) INJECTION SYRINGE
3.0000 mL | INJECTION | Freq: Three times a day (TID) | INTRAMUSCULAR | Status: DC
Start: 2023-01-01 — End: 2023-01-02
  Administered 2023-01-01: 3 mL
  Administered 2023-01-01 (×2): 0 mL
  Administered 2023-01-02: 3 mL

## 2023-01-01 MED ORDER — ACETAMINOPHEN 325 MG TABLET
975.0000 mg | ORAL_TABLET | Freq: Once | ORAL | Status: AC
Start: 2023-01-01 — End: 2023-01-01
  Administered 2023-01-01: 975 mg via ORAL

## 2023-01-01 MED ORDER — GABAPENTIN 300 MG CAPSULE
300.0000 mg | ORAL_CAPSULE | Freq: Once | ORAL | Status: AC
Start: 2023-01-01 — End: 2023-01-01
  Administered 2023-01-01: 300 mg via ORAL

## 2023-01-01 MED ORDER — NALOXONE 0.4 MG/ML INJECTION SOLUTION
0.4000 mg | INTRAMUSCULAR | Status: DC | PRN
Start: 2023-01-01 — End: 2023-01-02

## 2023-01-01 MED ORDER — DEXAMETHASONE SODIUM PHOSPHATE (PF) 10 MG/ML INJECTION SOLUTION
INTRAMUSCULAR | Status: AC
Start: 2023-01-01 — End: 2023-01-01
  Filled 2023-01-01: qty 1

## 2023-01-01 MED ORDER — MORPHINE 2 MG/ML INJECTION WRAPPER
2.0000 mg | INJECTION | INTRAMUSCULAR | Status: DC | PRN
Start: 2023-01-01 — End: 2023-01-02
  Administered 2023-01-01 – 2023-01-02 (×3): 2 mg via INTRAVENOUS
  Filled 2023-01-01 (×3): qty 1

## 2023-01-01 MED ORDER — ACETAMINOPHEN 325 MG TABLET
650.0000 mg | ORAL_TABLET | ORAL | Status: DC | PRN
Start: 2023-01-01 — End: 2023-01-02

## 2023-01-01 MED ORDER — OXYCODONE-ACETAMINOPHEN 5 MG-325 MG TABLET
1.0000 | ORAL_TABLET | ORAL | Status: DC | PRN
Start: 2023-01-01 — End: 2023-01-02
  Administered 2023-01-01 – 2023-01-02 (×2): 1 via ORAL
  Filled 2023-01-01 (×3): qty 1

## 2023-01-01 MED ORDER — ONDANSETRON HCL (PF) 4 MG/2 ML INJECTION SOLUTION
8.0000 mg | Freq: Once | INTRAMUSCULAR | Status: AC
Start: 2023-01-01 — End: 2023-01-01
  Administered 2023-01-01: 8 mg via INTRAVENOUS

## 2023-01-01 MED ORDER — OXYCODONE ER 10 MG TABLET,CRUSH RESISTANT,EXTENDED RELEASE 12 HR
EXTENDED_RELEASE_ORAL_TABLET | ORAL | Status: AC
Start: 2023-01-01 — End: 2023-01-01
  Filled 2023-01-01: qty 1

## 2023-01-01 MED ORDER — ASPIRIN 81 MG CHEWABLE TABLET
81.0000 mg | CHEWABLE_TABLET | Freq: Two times a day (BID) | ORAL | Status: DC
Start: 2023-01-02 — End: 2023-01-02
  Administered 2023-01-02: 81 mg via ORAL
  Filled 2023-01-01: qty 1

## 2023-01-01 MED ORDER — PROPOFOL 10 MG/ML IV BOLUS
INJECTION | Freq: Once | INTRAVENOUS | Status: DC | PRN
Start: 2023-01-01 — End: 2023-01-01
  Administered 2023-01-01: 100 mg via INTRAVENOUS
  Administered 2023-01-01: 50 mg via INTRAVENOUS

## 2023-01-01 MED ORDER — MIDAZOLAM 5 MG/ML INJECTION WRAPPER
2.0000 mg | Freq: Once | INTRAMUSCULAR | Status: DC | PRN
Start: 2023-01-01 — End: 2023-01-01
  Administered 2023-01-01: 2 mg via INTRAVENOUS

## 2023-01-01 MED ORDER — FAMOTIDINE (PF) 20 MG/2 ML INTRAVENOUS SOLUTION
20.0000 mg | Freq: Once | INTRAVENOUS | Status: DC
Start: 2023-01-01 — End: 2023-01-01

## 2023-01-01 MED ORDER — FAMOTIDINE (PF) 20 MG/2 ML INTRAVENOUS SOLUTION
INTRAVENOUS | Status: AC
Start: 2023-01-01 — End: 2023-01-01
  Filled 2023-01-01: qty 2

## 2023-01-01 MED ORDER — ACETAMINOPHEN 325 MG TABLET
975.0000 mg | ORAL_TABLET | Freq: Four times a day (QID) | ORAL | Status: AC
Start: 2023-01-01 — End: 2023-01-01
  Administered 2023-01-01 (×2): 975 mg via ORAL
  Filled 2023-01-01 (×2): qty 3

## 2023-01-01 MED ORDER — SODIUM CHLORIDE 0.9 % (FLUSH) INJECTION SYRINGE
3.0000 mL | INJECTION | INTRAMUSCULAR | Status: DC | PRN
Start: 2023-01-01 — End: 2023-01-02

## 2023-01-01 MED ORDER — TRANEXAMIC ACID 1,000 MG/10 ML (100 MG/ML) INTRAVENOUS SOLUTION
INTRAVENOUS | Status: AC
Start: 2023-01-01 — End: 2023-01-01
  Filled 2023-01-01: qty 10

## 2023-01-01 MED ORDER — FENTANYL (PF) 50 MCG/ML INJECTION WRAPPER
50.0000 ug | INJECTION | INTRAMUSCULAR | Status: DC | PRN
Start: 2023-01-01 — End: 2023-01-01
  Administered 2023-01-01 (×2): 50 ug via INTRAVENOUS

## 2023-01-01 MED ORDER — GABAPENTIN 300 MG CAPSULE
ORAL_CAPSULE | ORAL | Status: AC
Start: 2023-01-01 — End: 2023-01-01
  Filled 2023-01-01: qty 1

## 2023-01-01 MED ORDER — ETHYL ALCOHOL 62 % (NOZIN NASAL SANITIZER) NASAL SOLUTION - BULK BOTTLE
1.0000 | Freq: Once | NASAL | Status: AC
Start: 2023-01-01 — End: 2023-01-01
  Administered 2023-01-01: 1 via NASAL

## 2023-01-01 MED ORDER — IPRATROPIUM 0.5 MG-ALBUTEROL 3 MG (2.5 MG BASE)/3 ML NEBULIZATION SOLN
3.0000 mL | INHALATION_SOLUTION | Freq: Once | RESPIRATORY_TRACT | Status: DC | PRN
Start: 2023-01-01 — End: 2023-01-01

## 2023-01-01 MED ORDER — TRAMADOL 50 MG TABLET
50.0000 mg | ORAL_TABLET | Freq: Four times a day (QID) | ORAL | Status: DC
Start: 2023-01-01 — End: 2023-01-02
  Administered 2023-01-01 – 2023-01-02 (×3): 50 mg via ORAL
  Administered 2023-01-02: 0 mg via ORAL
  Administered 2023-01-02: 50 mg via ORAL
  Filled 2023-01-01 (×4): qty 1

## 2023-01-01 MED ORDER — FENTANYL (PF) 50 MCG/ML INJECTION WRAPPER
25.0000 ug | INJECTION | INTRAMUSCULAR | Status: DC | PRN
Start: 2023-01-01 — End: 2023-01-01

## 2023-01-01 MED ORDER — LIDOCAINE (PF) 100 MG/5 ML (2 %) INTRAVENOUS SYRINGE
INJECTION | Freq: Once | INTRAVENOUS | Status: DC | PRN
Start: 2023-01-01 — End: 2023-01-01
  Administered 2023-01-01: 60 mg via INTRAVENOUS

## 2023-01-01 MED ORDER — ETHYL ALCOHOL 62 % (NOZIN NASAL SANITIZER) NASAL SOLUTION - BULK BOTTLE
1.0000 | Freq: Three times a day (TID) | NASAL | Status: DC
Start: 2023-01-01 — End: 2023-01-02
  Administered 2023-01-01 – 2023-01-02 (×3): 1 via NASAL

## 2023-01-01 MED ORDER — LACTATED RINGERS INTRAVENOUS SOLUTION
INTRAVENOUS | Status: DC
Start: 2023-01-01 — End: 2023-01-01
  Administered 2023-01-01: 0 mL via INTRAVENOUS

## 2023-01-01 MED ORDER — BETHANECHOL CHLORIDE 25 MG TABLET
6.2500 mg | ORAL_TABLET | Freq: Three times a day (TID) | ORAL | Status: DC
Start: 2023-01-01 — End: 2023-01-02
  Administered 2023-01-01 – 2023-01-02 (×3): 6.25 mg via ORAL
  Filled 2023-01-01 (×3): qty 1

## 2023-01-01 MED ORDER — HYDROMORPHONE 2 MG/ML INJECTION WRAPPER
0.2000 mg | INJECTION | INTRAMUSCULAR | Status: DC | PRN
Start: 2023-01-01 — End: 2023-01-02

## 2023-01-01 MED ORDER — SODIUM CHLORIDE 0.9 % (FLUSH) INJECTION SYRINGE
3.0000 mL | INJECTION | INTRAMUSCULAR | Status: DC | PRN
Start: 2023-01-01 — End: 2023-01-01

## 2023-01-01 MED ORDER — KETOROLAC 30 MG/ML (1 ML) INJECTION SOLUTION
15.0000 mg | Freq: Three times a day (TID) | INTRAMUSCULAR | Status: DC
Start: 2023-01-01 — End: 2023-01-02
  Administered 2023-01-01 – 2023-01-02 (×3): 15 mg via INTRAVENOUS
  Filled 2023-01-01 (×3): qty 1

## 2023-01-01 MED ORDER — OXYCODONE ER 10 MG TABLET,CRUSH RESISTANT,EXTENDED RELEASE 12 HR
10.0000 mg | EXTENDED_RELEASE_ORAL_TABLET | Freq: Once | ORAL | Status: AC
Start: 2023-01-01 — End: 2023-01-01
  Administered 2023-01-01: 10 mg via ORAL

## 2023-01-01 MED ORDER — OXYCODONE-ACETAMINOPHEN 5 MG-325 MG TABLET
2.0000 | ORAL_TABLET | ORAL | Status: DC | PRN
Start: 2023-01-01 — End: 2023-01-02
  Filled 2023-01-01: qty 2

## 2023-01-01 MED ORDER — LACTATED RINGERS INTRAVENOUS SOLUTION
INTRAVENOUS | Status: DC
Start: 2023-01-01 — End: 2023-01-01

## 2023-01-01 MED ORDER — ROCURONIUM 10 MG/ML INTRAVENOUS SOLUTION
Freq: Once | INTRAVENOUS | Status: DC | PRN
Start: 2023-01-01 — End: 2023-01-01
  Administered 2023-01-01: 40 mg via INTRAVENOUS

## 2023-01-01 MED ORDER — ROPIVACAINE (PF) 2 MG/ML (0.2 %) INJECTION SOLUTION
INTRAMUSCULAR | Status: AC
Start: 2023-01-01 — End: 2023-01-01
  Filled 2023-01-01: qty 30

## 2023-01-01 MED ORDER — TRANEXAMIC ACID 1,000 MG/10 ML (100 MG/ML) INTRAVENOUS SOLUTION
1000.0000 mg | Freq: Once | INTRAVENOUS | Status: AC
Start: 2023-01-01 — End: 2023-01-01
  Administered 2023-01-01: 1000 mg via INTRAVENOUS

## 2023-01-01 MED ORDER — LACTATED RINGERS INTRAVENOUS SOLUTION
INTRAVENOUS | Status: DC
Start: 2023-01-01 — End: 2023-01-02

## 2023-01-01 MED ORDER — MULTIVITAMIN WITH FOLIC ACID 400 MCG TABLET
1.0000 | ORAL_TABLET | Freq: Every day | ORAL | Status: DC
Start: 2023-01-01 — End: 2023-01-02
  Administered 2023-01-01 – 2023-01-02 (×2): 1 via ORAL
  Filled 2023-01-01 (×2): qty 1

## 2023-01-01 MED ORDER — DEXAMETHASONE SODIUM PHOSPHATE (PF) 10 MG/ML INJECTION SOLUTION
10.0000 mg | Freq: Once | INTRAMUSCULAR | Status: AC
Start: 2023-01-01 — End: 2023-01-01
  Administered 2023-01-01: 10 mg via INTRAVENOUS

## 2023-01-01 MED ORDER — APREPITANT 40 MG CAPSULE
ORAL_CAPSULE | ORAL | Status: AC
Start: 2023-01-01 — End: 2023-01-01
  Filled 2023-01-01: qty 1

## 2023-01-01 SURGICAL SUPPLY — 68 items
ADH SKNCLS CYNCRLT SKINSTITCH LIQUID PREC APPL TIP NONST LF  DISP .5ML (SUTURE/WOUND CLOSURE) ×1 IMPLANT
BANDAGE ELT 5.8YDX4IN NONST SLFCLS ELAS KNIT TEAL END STCH VELCRO COTTON POLY BLND STD LGTH COMPRESS (WOUND CARE SUPPLY) ×1 IMPLANT
BANDAGE ELT 5.8YDX6IN NONST SLFCLS ELAS KNIT STRCH VELCRO COTTON POLY BLND STD LGTH COMPRESS BGE HNY (WOUND CARE SUPPLY) ×1 IMPLANT
BASEPLATE TIB TRIATH 3 KNEE TRIT (IMPLANTS KNEE) ×1 IMPLANT
BLADE 10 2 END CBNSTL SURG STRL DISP (SURGICAL CUTTING SUPPLIES) ×5 IMPLANT
BLADE SAW 90X25X1.27MM SGTL STRL LF (SURGICAL CUTTING SUPPLIES) ×1 IMPLANT
BLADE SAW STD MAKO STRL LF  DISP (SURGICAL CUTTING SUPPLIES) ×2 IMPLANT
BLADE SURG CLPR W 37.2MM GP EXIST HNDL GTT IN CHRG .23MM NONST LF  DISP (MED SURG SUPPLIES) IMPLANT
CEMENT BONE SIMPLEX RADOPQ FD STRL (CEMENT) IMPLANT
CONV USE 65308 - DRAPE FNFLD ABS REINF 77X53IN 43528 PRXM LF  STRL DISP SURG SMS 44X23IN (DRAPE/PACKS/SHEETS/OR TOWEL) ×2
CONV USE ITEM 337687 - DRAPE REINF FNFLD 90X44IN LF  STRL DISP SURG (DRAPE/PACKS/SHEETS/OR TOWEL) ×4
CORD RETRACT SILI (DENTAL PRODUCTS) IMPLANT
COUNTER 20 CNT BLOCK ADH NEEDLE STRL LF  RD SHARP FOAM 15.75X11.5X14IN DISP (MED SURG SUPPLIES) ×1 IMPLANT
CUFF TOURNIQUET RYL BLU 30X4IN COLOR CUF CYL 2 PORT 1 BLADDER QC LOW PROF 40IN STRL LF  DISP (MED SURG SUPPLIES) ×1 IMPLANT
DETERGENT INSTR 22OZ TRNSPT GEL RINSE FREE NEUT PH PREKLENZ CLR PLSNT LF (MISCELLANEOUS PT CARE ITEMS) ×1 IMPLANT
DEVICE SUCT 2 FILTER CHAMBER SCKR STRL LF  DISP (MED SURG SUPPLIES) ×1 IMPLANT
DISCONTINUED USE 162189 - BANDAGE ELT 5.8YDX6IN NONST SLFCLS ELAS KNIT STRCH VELCRO COTTON POLY BLND STD LGTH COMPRESS BGE HNY (WOUND CARE SUPPLY) ×1 IMPLANT
DRAPE 76X55IN 3 QT HALYARD LF  STRL DISP SURG (DRAPE/PACKS/SHEETS/OR TOWEL) ×1 IMPLANT
DRAPE FNFLD ABS REINF 77X53IN 43528 PRXM LF  STRL DISP SURG SMS 44X23IN (DRAPE/PACKS/SHEETS/OR TOWEL) ×2 IMPLANT
DRAPE INCS ANTIMIC 23X23IN IOBN2 TRNSPR (DRAPE/PACKS/SHEETS/OR TOWEL) ×1 IMPLANT
DRAPE REINF FNFLD 90X44IN LF  STRL DISP SURG (DRAPE/PACKS/SHEETS/OR TOWEL) ×4 IMPLANT
DRESS 12X4IN PS MPLX BR FOAM ACUTE SURG WOUND (WOUND CARE SUPPLY) IMPLANT
DRESS COMPRESS 13FTX6IN JNS COTTON RL HI ABS LF  STRL .5LB (WOUND CARE SUPPLY) ×1 IMPLANT
DRESS PETRO 8X3IN CURAD GAUZE_NADH NONOCCLUSIVE IMPREGNATE (WOUND CARE SUPPLY) ×1 IMPLANT
DRESS PETRO 9X5IN CURAD XR COTTON NONADH OCL IMPREGNATE LF  STRL WHT (WOUND CARE SUPPLY) ×1 IMPLANT
ELECTRODE ESURG BLADE PNCL 15FT VLAB EDGE TELESCP SMOKE EVAC (SURGICAL CUTTING SUPPLIES) ×1 IMPLANT
ELECTRODE PATIENT RTN 9FT VLAB C30- LB RM PHSV ACRL FOAM CORD NONIRRITATE NONSENSITIZE ADH STRP (SURGICAL CUTTING SUPPLIES) ×1 IMPLANT
FEM 4 PA CRCTE RTN BEAD TRIATH KNEE RGT COM STRL LF (IMPLANTS KNEE) ×1 IMPLANT
GLOVE SURG 6.5 LF  PF BEAD CUF STRL CRM 11.3IN PROTEXIS PI PLISPRN THK9.1 MIL (GLOVES AND ACCESSORIES) ×2 IMPLANT
GLOVE SURG 6.5 LF  PF SMOOTH BEAD CUF INTLK STRL BLU 11.3IN PROTEXIS NEU-THERA PLISPRN THK7.9 MIL (GLOVES AND ACCESSORIES) ×1 IMPLANT
GLOVE SURG 7 LF  BEAD CUF DERMASHIELD PLISPRN (GLOVES AND ACCESSORIES) ×1 IMPLANT
GLOVE SURG 7.5 LTX PF SMOOTH BEAD CUF STRL YW 12IN PROTEXIS (GLOVES AND ACCESSORIES) ×1 IMPLANT
GLOVE SURG 8 LTX PF SMOOTH BEAD CUF STRL YW 12IN PROTEXIS NEU-THERA DDRGL THK8.7 MIL (GLOVES AND ACCESSORIES) ×1 IMPLANT
GOWN SURG XL STD LGTH L3 HKLP CLSR RGLN SLEEVE TWL STRL LF  DISP GRN AERO BLU PRFRM FBRC (DRAPE/PACKS/SHEETS/OR TOWEL) ×1 IMPLANT
GOWN SURGICAL SIRUS SMS LG XLONG 52IN LEV 4 STRL LF DISP BLUE (GLOVES AND ACCESSORIES) ×1 IMPLANT
HEMOSTAT ABS 14X2IN FLXB SHR W_V SRGCL STRL DISP (WOUND CARE SUPPLY) ×1 IMPLANT
INSERT TIB BRNG CRCTE RTN 3 9MM TRIATH X3 KNEE STRL LF (IMPLANTS KNEE) ×1 IMPLANT
KIT 1 PC PCKT DRP RIO RIO (DRAPE/PACKS/SHEETS/OR TOWEL) ×1 IMPLANT
LABEL MED CORRECT MED LABELING SYS 4 FLG 2 SHEET 24 PRPRNT STRL (MED SURG SUPPLIES) ×1 IMPLANT
MARKER SURG CKPNT KIT STRL LF  DISP (MED SURG SUPPLIES) ×1 IMPLANT
MATTRESS TRANSF 78X34IN AIRPAL C1000LB LONG STD 2 BELT 2 HOSE ATTACHMENT PNT SNAP BUTTON LBL DISP (MED SURG SUPPLIES) ×1 IMPLANT
NAVIGATE VIZADISC KNEE TRK KIT SYSTEM STRL LF  DISP (MED SURG SUPPLIES) ×1 IMPLANT
NEEDLE 1.5IN 18GA POLYPROP FIL LL HUB DEHP-FR STRL BLUNT BD REG WL LF  DISP RD (MED SURG SUPPLIES) ×1 IMPLANT
PADDING CAST WYTEX COTTON L4 YD X W6 IN UNDERCAST STERILE LATEX FREE (MED SURG SUPPLIES) ×2 IMPLANT
PATEL 9MM 29MM TRIT METAL ASYM TRIATH KNEE COM STRL (IMPLANTS KNEE) ×1 IMPLANT
PEN SURG MRKNG DISP RLR LBL STRL LF  6IN (MED SURG SUPPLIES) IMPLANT
PIN FIX 110MM 4MM BONE STRL (IMPLANTS TRAUMA) ×1 IMPLANT
PIN FIX 140MM 4MM STR KNEE STRL (IMPLANTS TRAUMA) ×1 IMPLANT
POSITION OR LEG KIT DISP (MED SURG SUPPLIES) IMPLANT
SEALER ESURG .226IN .137IN AQUAMANTYS 6 30D .236IN SPC BIPOLAR 2 ELECTRODE HMST 5.1IN STRL LF  DISP (SURGICAL CUTTING SUPPLIES) ×1 IMPLANT
SET INTPLS SUCT TUBE FAN SPRAY TIP HANDPC STRL LF  DISP (MED SURG SUPPLIES) ×1 IMPLANT
SOL IRRG 0.9% NACL 2000ML PRSV FR N-PYRG FLXB CONTAINR STRL LF (MEDICATIONS/SOLUTIONS) ×1 IMPLANT
SOL IV 0.9% NACL 500ML PLASTIC CONTAINR VIAFLEX LF (MEDICATIONS/SOLUTIONS) ×1 IMPLANT
SOL SURG PREP 26ML DRPRP 74% ISPRP 0.7% IOD POVACRYLEX SLF CNTN APPL SKIN STRL PREOP (MED SURG SUPPLIES) ×1 IMPLANT
SPONGE GAUZE 4X4IN MDCHC COTTON 12 PLY TY 7 LF  STRL DISP (WOUND CARE SUPPLY) ×3 IMPLANT
SPONGE LAP 18X18IN PREWASH RIGID TRY STRL LF  WHT (MED SURG SUPPLIES) ×2 IMPLANT
SUTURE 1 CT STRATAFIX PDS + 18IN VIOL ABS KNOTLESS TISS CONTROL SMTR ABS (SUTURE/WOUND CLOSURE) ×1 IMPLANT
SUTURE 1 CT VICRYL 36IN VIOL BRD COAT ABS (SUTURE/WOUND CLOSURE) ×5 IMPLANT
SUTURE 2-0 CT1 STRATAFIX PDS + 18IN VIOL ABS KNOTLESS TISS CONTROL SMTR ABS (SUTURE/WOUND CLOSURE) IMPLANT
SUTURE 2-0 CT1 VICRYL 27IN UNDYED BRD COAT ABS (SUTURE/WOUND CLOSURE) ×3 IMPLANT
SUTURE 3-0 PS2 MONOCRYL MTPS 27IN UNDYED MONOF ABS (SUTURE/WOUND CLOSURE) ×1 IMPLANT
SUTURE 4-0 FS1 PROLENE 18IN BLU MONOF NONAB (SUTURE/WOUND CLOSURE) ×2 IMPLANT
SYRINGE GENTLCARE 60CC STRL CATH TIP PEEL PCH SUCT TRANSLUC BULB POLYPROP THRMPLST ELASTO DISP (MED SURG SUPPLIES) IMPLANT
SYRINGE LL 30ML LF  STRL CONCEN TIP GRAD N-PYRG DEHP-FR MED DISP (MED SURG SUPPLIES) ×1 IMPLANT
TOWEL 24X16IN COTTON BLU DISP SURG STRL LF (DRAPE/PACKS/SHEETS/OR TOWEL) ×4 IMPLANT
TRAY ~~LOC~~ ARTHROSCOPY ~~LOC~~ - ~~LOC~~ COMMUNITY HOSP (CUSTOM TRAYS & PACK) ×1 IMPLANT
TUBING SUCT CLR 6FT .25IN ARGYLE PVC NCDTV STR MALE FEMALE MLD CONN STRL LF (MED SURG SUPPLIES) IMPLANT
WOUND IRRG IRRISEPT DBRD CLNSG 0.05% CHG SYSTEM STRL LF (WOUND CARE SUPPLY) ×1 IMPLANT

## 2023-01-01 NOTE — H&P (Signed)
Paper H and P on chart, will be scanned to EMR.

## 2023-01-01 NOTE — Care Plan (Signed)
Medical Nutrition Therapy Assessment    Reason for assessment: low BMI    SUBJECTIVE : Patient reports having a good appetite prior to surgery.  She was also drinking Ensure daily for about the past month for additional nutrition prior to surgery.  She is petite and this has been patient's normal weight for several years.  She denies difficulty eating but limits some foods 2' history GI problems.    OBJECTIVE:   PMH includes:  CAD, MI, BIS, esophageal reflux, hiatal hernia  Current Diet Order/Nutrition Support:  DIET REGULAR Do you want to initiate MNT Protocol? Yes    Height Used for Calculations: 160 cm (5\' 3" )  Weight Used For Calculations: 45.4 kg (100 lb 1.4 oz)  BMI (kg/m2): 17.77  Ideal Body Weight (IBW) (kg): 52.72  % Ideal Body Weight: 86.12    Estimated Needs:  Energy Calorie Requirements: 1589 kcal/day (35 kcal/kg)  Protein Requirements (gms/day): 68 gm/day (1.5 gm/kg)    Comments: Admission problem includes osteoarthritis with planned total knee replacement.    Plan/Interventions :   Add Ensure to supplement.  Encourage intake in addition to her meal intakes.   Ideally patient can gain some weight towards an ideal weight of 115 lbs    Nutrition Diagnosis: Increased nutrient needs related to  surgery  as evidenced by BMI less than 19     Nadyne Coombes, RDLD

## 2023-01-01 NOTE — Anesthesia Preprocedure Evaluation (Signed)
ANESTHESIA PRE-OP EVALUATION  Planned Procedure: MAKO ROBOTIC ASSISTED RIGHT TOTAL KNEE ARTHROPLASTY USING TRIATHLON IMPLANTS (Right: Knee)  Review of Systems     anesthesia history negative     patient summary reviewed  nursing notes reviewed        Pulmonary   asthma (exercise induced, seasonal),   Cardiovascular    ECG reviewed ,No peripheral edema,  Exercise Tolerance: > or = 4 METS        GI/Hepatic/Renal    hiatal hernia, GERD and well controlled        Endo/Other    osteoarthritis,      Neuro/Psych/MS   negative neuro/psych ROS,      Cancer  CA (basal cell skin cancer),                       Physical Assessment      Airway       Mallampati: II    TM distance: >3 FB    Neck ROM: full  Mouth Opening: good.            Dental           (+) implants           Pulmonary    Breath sounds clear to auscultation  (-) no rhonchi, no decreased breath sounds, no wheezes, no rales and no stridor     Cardiovascular    Rhythm: regular  Rate: Normal  (-) no friction rub, carotid bruit is not present, no peripheral edema and no murmur     Other findings  4 implants- molars            Plan  ASA 2     Planned anesthesia type: general     general anesthesia with endotracheal tube intubation      PONV Plan:  I plan to administer pharmcologic prophalaxis antiemetics  POV PLAN:   plan for postoperative opioid use            Intravenous induction     Anesthesia issues/risks discussed are: Dental Injuries, Eye /Visual Loss, Nerve Injuries, PONV, Stroke, Aspiration, Difficult Airway, Cardiac Events/MI, Intraoperative Awareness/ Recall, Blood Loss and Sore Throat.  Anesthetic plan and risks discussed with patient  signed consent obtained          Patient's NPO status is appropriate for Anesthesia.           Plan discussed with CRNA.    (Medical clearance by Jeral Pinch PAC. Have glidescope available)

## 2023-01-01 NOTE — OR Nursing (Signed)
DRUG ALLERGIES VERIFIED? YES  PATIENT IDENTIFIED? YES  CONSENT AND SIDE VERIFIED? PER PT & chart  PICTURES TAKEN? YES  IRRISEPT SOLUTION USED INTRAOP   MEASUREMENTS  THIGH--15 1/4 INCHES  CALF--12 1/4 INCHES

## 2023-01-01 NOTE — Nurses Notes (Signed)
Patient arrived to floor at this time, admitted to room 379. Transferred from stretcher to bed via hovermat. Patient A&O X4. Patient given prn and scheduled medication given for pain. Dressing to right knee remains C,D,I. Patient resting in bed at this time with call bell in reach.

## 2023-01-01 NOTE — Interval H&P Note (Signed)
H & P updated the day of the procedure.  1.  H&P completed within 30 days of surgical procedure and has been reviewed within 24 hours of admission but prior to surgery or a procedure requiring anesthesia services, the patient has been examined, and no change has occured in the patients condition since the H&P was completed.       Change in medications: No        No LMP recorded.      Comments:     2.  Patient continues to be appropriate candidate for planned surgical procedure. YES    Haille Pardi, DO

## 2023-01-01 NOTE — OR Surgeon (Signed)
[place] MEDICINE [facility]  Operative Note     PATIENT NAME:  [name], [name]  MRN:  [id number]  DOB:  March 12, 1951    Date of Procedure:  01/01/2023  Preoperative Diagnosis: OSTEOARTHRITIS RIGHT KNEE   Postoperative Diagnoses:  ANTERIOR CRUCIATE LIGAMENT TEAR AND OSTEOARTHRITIS RIGHT KNEE   Procedure Performed: ProcProcedure(s) (LRB):  MAKO ROBOTIC ASSISTED RIGHT TOTAL KNEE ARTHROPLASTY USING TRIATHLON IMPLANTS (Right)   Implants:  Implant Name Type Inv. Item Serial No. Manufacturer Lot No. LRB No. Used Action   PATEL  TRIT METAL ASYM TRIATH KNEE COM STRL - [id number]  PATEL  TRIT METAL ASYM TRIATH KNEE COM STRL  [employer] [id number] Right 1 Implanted   FEM 4 PA CRCTE RTN BEAD TRIATH KNEE RGT COM STRL LF - [id number]  FEM 4 PA CRCTE RTN BEAD TRIATH KNEE RGT COM STRL LF  [employer] [id number] Right 1 Implanted   BASEPLATE TIB TRIATH 3 KNEE TRIT - [id number]  BASEPLATE TIB TRIATH 3 KNEE TRIT  [employer] [id number] Right 1 Implanted   INSERT TIB BRNG CRCTE RTN 3 TRIATH X3 KNEE STRL LF - [id number]  INSERT TIB BRNG CRCTE RTN 3 TRIATH X3 KNEE STRL LF  [employer] [id number] Right 1 Implanted      [employer]    Surgeon: Quenton Fetter, DO   Anesthesia: Anesthesiologist: Nydia Bouton, DO  CRNA: Rada Hay, CRNA   OR Staff: Circulator: Stasia Cavalier, RN  PERIOPERATIVE CARE ASSISTANT: Abundio Miu, PCA; Selinda Eon, PCA  Scrub Person: Jennette Bill, RN  Scrub First Assist: Doreene Adas, CST   Anticoagulation: ASA   Estimated Blood Loss: * No blood loss amount entered *   Specimens: * No specimens in log *   Complications: None immediate  Indications For Procedure:  [name]  is a very pleasant  71 y.o. female  presenting  for OSTEOARTHRITIS RIGHT KNEE   End stage arthritis interfering with ADL and failing multiple non-surgical treatments.  Discussed robotic technology, thromoboembolism, infection and risks, benefits, indications,  and complications of procedure. Informed signed consent obtained.    DESCRIPTION OF THE PROCEDURE:  Patient identified and time out completed with entire surgical team.  Antibiotics and tranexamic acid administered prior to inflation of torniquet.  Satisfactory anesthesia obtained. Tourniquet was placed over padding on the thigh.   The leg was prepped with DuraPrep and draped in a sterile manner.  Elevation exsanguination, tourniquet to 350 mmHg.    Anterior approach to the knee through medial parapatellar arthrotomy, the knee exposed.  We placed the femoral array inside the incision.  Tibial array outside. The femoral and tibial markers were placed and we successfully registered the knee. The existing components were removed and robotic plan for bone cuts and balancing completed in flexion and extension.  The robot was then utilized to perform all cuts.  Posterior osteophytes were removed from the femur.   Patella was resected at the reflection of the patellar ligament.   Trials were positioned with excellent extension and flexion with good rotation, alignment and stability verified robotically. The patellar component was implanted and the component was verified to be well fixed.  Patellar tracking was evaluated and found to be satisfactory. Knee was copiously irrigated with pulsatile lavage, then one-minute soak with Irrisept solution.  The tibial baseplate impacted into position.  The knee then taken through range of motion with full extension and flexion with good rotation, alignment and stability, balance and ROM again verified with MAKO  robotics.    Wound was irrigated.  The arthrotomy was closed in interrupted fashion.  The wound was closed in layers finishing with subcuticular stitch on the skin.  Sterile bandage was applied, and the tourniquet was released.  The patient was awoken from anesthesia and taken to recovery room under the supervision of anesthesia.  Quenton Fetter, DO

## 2023-01-01 NOTE — PT Evaluation (Signed)
[facility] Medicine [facility]  [address]  [place], [address]  [phone]  (Fax) [phone]  Rehabilitation Services  Physical Therapy Inpatient TKA Initial Evaluation    Patient Name: [name]  Date of Birth: 24-Oct-1950  Height: Height: 160 cm (5' 2.99")  Weight: Weight: 45.4 kg (100 lb 1.4 oz)  Room/Bed: 379/A  Payor: HU[employer] MEDICARE / Plan: [employer] MEDICARE ADV [place] / Product Type: PPO /       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    Chronic idiopathic constipation     Constipation     Edema     Esophageal reflux     Hiatal hernia     IBS (irritable colon syndrome)     Osteoarthritis     Wears glasses            Assessment:      (P) [name] is a 72 y/o female s/p R TKA on 01/01/23. At baseline, patient is independent with all ADLs and self care and indicates she is very active. She demonstrates ability to perform all bed mobility with modified independence. She performs sit to stand transfers with CGA and cues for kickstand position. She ambulates 170 feet with 2WW and CGA for safety. She will benefit from continued skilled PT to addres post op TKA protocol, including strengthening, ROM, balance, endurance, and functional mobility.    Discharge Needs:    Equipment Recommendation: (P) front wheeled walker      The patient presents with mobility limitations due to impaired balance, impaired range of motion, impaired strength, and impaired functional activity tolerance that significantly impair/prevent patient's ability to participate in mobility-related activities of daily living (MRADLs) including  ambulation and transfers in order to safely complete, bathing, laundering/household tasks, safely entering/exiting the home. This functional mobility deficit can be sufficiently resolved with the use of a (P) front wheeled  walker  in order to decrease the risk of falls, morbidity, and mortality in performance of these MRADLs.  Patient is able to safely use this assistive device.    Discharge Disposition: (P) home with outpatient services    JUSTIFICATION OF DISCHARGE RECOMMENDATION   Based on current diagnosis, functional performance prior to admission, and current functional performance, this patient requires continued PT services in (P) home with outpatient services in order to achieve significant functional improvements in these deficit areas: (P) aerobic capacity/endurance, ergonomics and body mechanics, gait, locomotion, and balance, muscle performance, neuromuscular, ROM (range of motion).        Plan:   Current Intervention: (P) balance training, gait training, home exercise program, neuromuscular re-education, patient/family education, ROM (range of motion), stair training, strengthening, stretching, transfer training  To provide physical therapy services (P)  (1-3x a day monday-saturday)  for duration of (P) until discharge.    The risks/benefits of therapy have been discussed with the patient/caregiver and he/she is in agreement with the established plan of care.       Subjective & Objective     Past Medical History:   Diagnosis Date    Arthritis     Asthma     Barrett's esophagus     egd 03/2018 patel  q 3  years    Bruises easily     Cancer (CMS HCC)     SKIN CA FROM  A MOLE    Chronic idiopathic constipation     Constipation     Edema     Esophageal reflux     Hiatal hernia     IBS (irritable colon syndrome)     Osteoarthritis     Wears glasses             Past Surgical History:   Procedure Laterality Date    COLONOSCOPY N/A     2018 due 5 years     Jan 2024  due    5 yrs    FACELIFT      HX DILATION AND CURETTAGE N/A     HX HIP REPLACEMENT Right     fx  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 PT orders are to evaluate and treat per TKA protocol.   Medical Lines PIV Line   Respiratory Status room air   Existing Precautions/Restrictions no known precautions/limitations   Mutuality/Individual Preferences   Individualized Care Needs Ambulate 1-3x daily   Patient-Specific Goals (Include Timeframe) Return home upon discharge   Plan of Care Reviewed With patient   Patient would like to participate in bedside shift report Yes   Living Environment   Lives With spouse   Living Arrangements house   Home Assessment: No Problems Identified   Home Accessibility stairs to enter home;stairs within home   Stairs Within Home, Primary   Number of Stairs, Within Home, Primary other (see comments)  (15)   Home Main Entrance   Number of Stairs, Main Entrance two   Functional Level Prior   Ambulation 0 - independent   Transferring 0 - independent   Toileting 0 - independent   Bathing 0 - independent   Dressing 0 - independent   Pre Treatment Status   Pre Treatment Patient Status Patient supine in bed;Call light within reach;Telephone within reach;Patient safety alarm activated;Nurse approved session   Support Present Pre Treatment  None   Communication Pre Treatment  Charge Nurse   Communication Pre Treatment Comment Cleared for PT   Cognitive Assessment/Interventions   Behavior/Mood Observations behavior appropriate to situation, WNL/WFL   Orientation Status oriented x 4   Attention WNL/WFL   Follows Commands WNL   Pre- Treatment Vital Signs   Vitals Comment Telemetry unavailable   Pre-Treatment Pain   Pretreatment Pain Rating 5/10   Pre/Posttreatment Pain Comment Post op pain   RUE Assessment   RUE Assessment WFL- Within Functional Limits   LUE Assessment   LUE Assessment WFL- Within Functional Limits   RLE Assessment   RLE Assessment  X-Exceptions   RLE ROM   (Not formally assessed)   RLE Strength Grossly 3+/5   LLE Assessment   LLE Assessment WFL- Within Functional Limits   Trunk Assessment   Trunk Assessment WFL for stated baseline   Mobility Assessment/Training   Additional Documentation Bed Mobility Assessment/Treatment (  Group);Transfer Assessment/Treatment (Group);Gait Assessment/Treatment (Group)   Bed Mobility Assessment/Treatment   Bed Mobility, Assistive Device bed rails;Head of Bed Elevated   Safety Issues decreased use of legs for bridging/pushing   Scoot/Bridge Independence modified independence   Sit to Supine, Independence modified independence   Supine-Sit Independence modified independence   Transfer Assessment/Treatment   Transfer Impairments pain;strength decreased   Sit-Stand-Sit, Assist Device walker, front wheeled   Sit-Stand Independence contact guard assist   Stand-Sit Independence contact guard assist   Maintains Weight-bearing Status (Transfers) able to maintain   Gait Assessment/Treatment   Total Distance Ambulated 170   Gait Speed Slight decrease   Impairments  pain;strength decreased   Assistive Device  walker, front wheeled   Distance in Feet 170   Independence  contact guard assist   Maintain Weight Bearing Status able to maintain   Post Treatment Status   Post Treatment Patient Status Patient supine in bed;Call light within reach;Telephone within reach;Patient safety alarm activated   Support Present Post Treatment  Other (See comments)  (OT)   Patient Effort excellent   Post-Treatment Pain   Posttreatment Pain Rating 5/10   Physical Therapy Clinical Impression   Assessment [name] is a 72 y/o female s/p R TKA on 01/01/23. At baseline, patient is independent with all ADLs and self care and indicates she is very active. She demonstrates ability to perform all bed mobility with modified independence. She performs sit to stand transfers with CGA and cues for kickstand position. She ambulates 170 feet with 2WW  and CGA for safety. She will benefit from continued skilled PT to addres post op TKA protocol, including strengthening, ROM, balance, endurance, and functional mobility.   Criteria for Skilled Therapeutic yes   Impairments Found (describe specific impairments) aerobic capacity/endurance;ergonomics and body mechanics;gait, locomotion, and balance;muscle performance;neuromuscular;ROM (range of motion)   Functional Limitations in Following  self-care;home management;community/leisure   Rehab Potential good   Therapy Frequency   (1-3x a day monday-saturday)   Predicted Duration of Therapy Intervention (days/wks) until discharge   Anticipated Equipment Needs at Discharge (PT) front wheeled walker   Anticipated Discharge Disposition home with outpatient services   Evaluation Complexity Justification   Patient History: Co-morbidity/factors that impact Plan of Care 1-2 that impact Plan of Care   Examination Components 3 or more Exam elements addressed   Presentation Stable: Uncomplicated, straight-forward, problem focused   Clinical Decision Making Low complexity   Evaluation Complexity Low complexity   Planned Therapy Interventions, PT Eval   Planned Therapy Interventions (PT) balance training;gait training;home exercise program;neuromuscular re-education;patient/family education;ROM (range of motion);stair training;strengthening;stretching;transfer training   Physical Therapy Time and Intention   Total PT Minutes: 16   (INSERT FLOWSHEET)    TOTAL KNEE ARTHROPLASTY (TKA) PHYSICAL THERAPY GOALS    1) AMBULATE 300 FEET WITH WALKER AND SBA.   2) TRANSFER BED TO CHAIR WITH SBA.   3) INCREASED STRENGTH TO 3+/5 KNEE EXTENSION.   4) INCREASED KNEE AAROM TO 4 TOP 90 DEGREES.  5) NEGOTIATES TRAINING STEPS USING HANDRAIL AND SBA.       Physical Therapy Inpatient TKA D/C Criteria        PATIENT/CAREGIVER EDUCATION  Educated on exercise program? YES  Can successfully demonstrate exercise form? YES  Can demonstrate safe/effective  assistance with functional mobility? YES     IMPAIRMENT BASED CRITERIA  Does the patient demonstrate knee ROM of at least 8-75 degrees on the involved leg? COMMENT Not formally assessed  Does the patient demonstrate a minimum  of 3+/5 quadricep strength? YES  Does the patient demonstrate the ability to maintain any weight bearing precautions? YES  Does the patient demonstrated sensation of all LE dermatomes? YES  Does the patient have adequate pain control of <4/10 for functional mobility at home? NO    FUNCTIONAL BASED CRITERIA  Does that patient demonstrate the ability to ambulate 80 feet with RW using CGA/SPV assistance? YES  Does the patient demonstrate the ability to perform bed mobility with no more than min assist? YES  Does the patient demonstrated the ability to sit to stand from the bed or chair with min assist? YES  Does the patient demonstrate the ability to walk up/down 3-5 stairs with min assist as required for home entry? COMMENT Deferred    ROM  Extension Deferred  Flexion Deferred        INTERVENTION MINUTES: EVALUATION 16 minutes    EVALUATION COMPLEXITY : CLINICAL DECISION MAKING OF LOW COMPLEXITY AS INDICATED BY PMH, PHYSICAL THERAPY ASSESSMENT OF MUSCULOSKELETAL AND NEUROLOGICAL SYSTEMS AND ACTIVITY LIMITATIONS. CLINICAL PRESENTATION IS STABLE AND UNCOMPLICATED    Therapist:     Phil Dopp, PT  01/01/2023, 18:45

## 2023-01-01 NOTE — Care Plan (Signed)
Problem: Adult Inpatient Plan of Care  Goal: Plan of Care Review  Outcome: Ongoing (see interventions/notes)  Goal: Patient-Specific Goal (Individualized)  Outcome: Ongoing (see interventions/notes)  Flowsheets (Taken 01/01/2023 1229)  Individualized Care Needs: pain control  Anxieties, Fears or Concerns: pain control  Patient-Specific Goals (Include Timeframe): pain tolerable upon time of discharge  Goal: Absence of Hospital-Acquired Illness or Injury  Outcome: Ongoing (see interventions/notes)  Goal: Optimal Comfort and Wellbeing  Outcome: Ongoing (see interventions/notes)  Goal: Rounds/Family Conference  Outcome: Ongoing (see interventions/notes)     Problem: Fall Injury Risk  Goal: Absence of Fall and Fall-Related Injury  Outcome: Ongoing (see interventions/notes)     Problem: Knee Arthroplasty  Goal: Optimal Coping  Outcome: Ongoing (see interventions/notes)  Goal: Absence of Bleeding  Outcome: Ongoing (see interventions/notes)  Goal: Effective Bowel Elimination  Outcome: Ongoing (see interventions/notes)  Goal: Fluid and Electrolyte Balance  Outcome: Ongoing (see interventions/notes)  Goal: Optimal Functional Ability  Outcome: Ongoing (see interventions/notes)  Goal: Absence of Infection Signs and Symptoms  Outcome: Ongoing (see interventions/notes)  Goal: Intact Neurovascular Status  Outcome: Ongoing (see interventions/notes)  Goal: Anesthesia/Sedation Recovery  Outcome: Ongoing (see interventions/notes)  Goal: Acceptable Pain Control  Outcome: Ongoing (see interventions/notes)  Goal: Nausea and Vomiting Relief  Outcome: Ongoing (see interventions/notes)  Goal: Effective Urinary Elimination  Outcome: Ongoing (see interventions/notes)  Goal: Effective Oxygenation and Ventilation  Outcome: Ongoing (see interventions/notes)     Problem: Pain Acute  Goal: Optimal Pain Control and Function  Outcome: Ongoing (see interventions/notes)   Patient A&O X3, pain managed with scheduled and PRN pain medication. Foot pumps  in use, fall precautions in place.

## 2023-01-01 NOTE — OR Nursing (Signed)
SMALL SKIN TEAR X2 RIGHT UPPER LEG WHERE TOURNIQUET SITE WAS, ADAPTIC AND STERILE WEBRIL PLACED THEN WRAPPED WITH TOTAL KNEE DRESSING  SMALL SKIN TEAR RIGHT LOWER LEG WHERE IOBAN DRAPE WAS REMOVED, XEROFORM AND STERILE WEBRIL PLACED WRAPPED WITH TOTAL KNEE DRESSING.  SURGEON AWARE OF SKIN TEARS.

## 2023-01-01 NOTE — Anesthesia Postprocedure Evaluation (Signed)
Anesthesia Post Op Evaluation    Patient: [name]  Procedure(s):  MAKO ROBOTIC ASSISTED RIGHT TOTAL KNEE ARTHROPLASTY USING TRIATHLON IMPLANTS    Last Vitals:Temperature: 36.5 C (97.7 F) (01/01/23 1103)  Heart Rate: 82 (01/01/23 1115)  BP (Non-Invasive): 129/70 (01/01/23 1115)  Respiratory Rate: 13 (01/01/23 1115)  SpO2: 100 % (01/01/23 1116)    No notable events documented.    Patient is sufficiently recovered from the effects of anesthesia to participate in the evaluation and has returned to their pre-procedure level.  Patient location during evaluation: PACU       Patient participation: complete - patient participated  Level of consciousness: awake and alert and responsive to verbal stimuli    Pain score: 3  Pain management: adequate  Airway patency: patent    Anesthetic complications: no  Cardiovascular status: acceptable  Respiratory status: acceptable  Hydration status: acceptable  Patient post-procedure temperature: Pt Normothermic   PONV Status: Absent

## 2023-01-01 NOTE — OT Evaluation (Signed)
[facility]  [address]  [place], [address]  [phone]  (Fax) [phone]  Rehabilitation Services  Occupational Therapy Inpatient Initial Evaluation      Patient Name: [name]  Date of Birth: 1951/02/10  Height: Height: 160 cm (5' 2.99")  Weight: Weight: 45.4 kg (100 lb 1.4 oz)  Room/Bed: 379/A  Payor: HUMANA MEDICARE / Plan: HUMANA MEDICARE ADV [place]IA / Product Type: PPO /         PMH:   Past Medical History:   Diagnosis Date    Arthritis     Asthma     Barrett's esophagus     egd 03/2018 patel  q 3 years    Bruises easily     Cancer (CMS HCC)     SKIN CA FROM  A MOLE    Chronic idiopathic constipation     Constipation     Edema     Esophageal reflux     Hiatal hernia     IBS (irritable colon syndrome)     Osteoarthritis     Wears glasses            Assessment:      Functional Level at Time of Session: (P) Patient is a pleasant 72 year old female admitted for R TKA. Prior to admission, she was independent in ADLs, IADLs and functional mobility. During OT evaluation, patient was alert, oriented x4, cooperative and able to follow multistep commands. She has full UB AROM and normal (5/5) UB strength. She was able to engage in dressing independently without needed assistance. OT educated patient on knee precautions, bed/car/bathroom transfers and EC/WS strategies. Patient confirmed understanding. Patient was transferred back to supine by PT. Patient to continue restorative care services during acute stay.    Discharge Needs:   Equipment Recommendation:  none anticipated    The patient presents with mobility limitations due to impaired range of motion and weight bearing restrictions that significantly impair/prevent patient's ability to participate in mobility-related activities of daily living (MRADLs) including  ambulation and transfers in order to safely complete, toileting, bathing, safely entering/exiting the home, in reasonable time. This functional  mobility deficit can be sufficiently resolved with the use of a Anticipated Equipment Needs at Discharge: (P) none anticipated  in order to decrease the risk of falls, morbidity, and mortality in performance of these MRADLs.  Patient is able to safely use this assistive device.    Discharge Disposition:  home with assist    JUSTIFICATION OF DISCHARGE RECOMMENDATION   Based on current diagnosis, functional performance prior to admission, and current functional performance, this patient requires continued OT services in Anticipated Discharge Disposition: (P) home with assist  in order to achieve significant functional improvements.    Plan:   Current Intervention:       To provide Occupational therapy services  Therapy Frequency: (P) Evaluation Only for duration of    .       The risks/benefits of therapy have been discussed with the patient/caregiver and he/she is in agreement with the established plan of care.       Subjective & Objective     MEDICAL HISTORY:   Past Medical History:   Diagnosis Date    Arthritis     Asthma     Barrett's esophagus     egd 03/2018 patel  q 3 years    Bruises easily     Cancer (CMS HCC)     SKIN CA FROM  A MOLE    Chronic idiopathic constipation     Constipation     Edema     Esophageal reflux     Hiatal hernia     IBS (irritable colon syndrome)     Osteoarthritis     Wears glasses          SURGICAL HISTORY:   Past Surgical History:   Procedure Laterality Date    COLONOSCOPY N/A     2018 due 5 years     Jan 2024  due    5 yrs    FACELIFT      HX DILATION AND CURETTAGE N/A     HX HIP REPLACEMENT Right     fx  dr gardner in [place]    HX HYSTERECTOMY      HX UPPER ENDOSCOPY N/A     01/2022  patel  due in  3 yrs       ipp    INSERT FLOW SHEET     01/01/23 1542   Rehab Session   Document Type evaluation   OT Visit Date 01/01/23   Total OT Minutes: 9   Patient Effort excellent   General Information   Patient Profile Reviewed yes   Medical Lines PIV Line   Respiratory Status room air   Pre  Treatment Status   Pre Treatment Patient Status Patient sitting on edge of bed   Support Present Pre Treatment  Other (See comments)  (PT)   Communication Pre Treatment  Charge Nurse   Communication Pre Treatment Comment Cleared for OT   Living Environment   Lives With spouse   Living Arrangements house   Home Accessibility stairs to enter home;stairs within home   Home Main Entrance   Number of Stairs, Main Entrance two   Stairs Within Home, Primary   Number of Stairs, Within Home, Primary other (see comments)  (15)   Functional Level Prior   Ambulation 0 - independent   Transferring 0 - independent   Toileting 0 - independent   Bathing 0 - independent   Dressing 0 - independent   Eating 0 - independent   Communication 0 - understands/communicates without difficulty   Swallowing 0-->swallows foods/liquids without difficulty   Self-Care   Dominant Hand left   Usual Activity Tolerance excellent   Current Activity Tolerance excellent   Vital Signs   Vitals Comment Pulse ox disconnected   Coping/Psychosocial   Observed Emotional State calm;cooperative   Verbalized Emotional State acceptance   Family/Support System   Family/Support Persons spouse   Involvement in Care not present at bedside   Coping/Psychosocial Response Interventions   Plan Of Care Reviewed With patient   Cognitive Assessment/Interventions   Behavior/Mood Observations behavior appropriate to situation, WNL/WFL   Orientation Status oriented x 4   Attention WNL/WFL   Follows Commands WFL   Vision Assessment/Interventions   Visual Impairment/Limitations WFL with corrective lenses   RUE Assessment   RUE Assessment WFL- Within Functional Limits   LUE Assessment   LUE Assessment WFL- Within Functional Limits   Grip Strength   Grip Left (4/5) good, left   Right Grip (4/5) good, right   Lower Body Dressing Assessment/Training   Position sitting   DRESSING ASSESSED Don Socks;Doff Socks   Independence Level  independent   Post Treatment Status   Post Treatment  Patient Status Patient supine in bed;Call light within reach;Telephone within reach;Patient safety alarm activated   Support Present Post Treatment  Other (See comments)  (PT)  Clinical Impression   Functional Level at Time of Session Patient is a pleasant 72 year old female admitted for R TKA. Prior to admission, she was independent in ADLs, IADLs and functional mobility. During OT evaluation, patient was alert, oriented x4, cooperative and able to follow multistep commands. She has full UB AROM and normal (5/5) UB strength. She was able to engage in dressing independently without needed assistance. OT educated patient on knee precautions, bed/car/bathroom transfers and EC/WS strategies. Patient confirmed understanding. Patient was transferred back to supine by PT. Patient to continue restorative care services during acute stay.   Therapy Frequency Evaluation Only   Anticipated Equipment Needs at Discharge none anticipated   Anticipated Discharge Disposition home with assist   Evaluation Complexity Justification   Occupational Profile Review Brief history   Performance Deficits 1-3 deficits   Clinical Decision Making Low analytic complexity   Evaluation Complexity Low       EVALUATION COMPLEXITY: CLINICAL DECISION MAKING OF LOW COMPLEXITY AS INDICATED BY PMH, OCCUPATIONAL THERAPY ASSESSMENT OF MUSCULOSKELETAL AND NEUROLOGICAL SYSTEMS AND ACTIVITY LIMITATIONS. CLINICAL PRESENTATION IS STABLE AND UNCOMPLICATED.      EVALUATION 9 minutes    Therapist:      Hildred Laser, OT,01/01/2023 16:18

## 2023-01-01 NOTE — Anesthesia Transfer of Care (Signed)
ANESTHESIA TRANSFER OF CARE   [name] is a 72 y.o. ,female, Weight: 45.4 kg (100 lb)   had Procedure(s):  MAKO ROBOTIC ASSISTED RIGHT TOTAL KNEE ARTHROPLASTY USING TRIATHLON IMPLANTS  performed  01/01/23   Primary Service: Quenton Fetter, DO    Past Medical History:   Diagnosis Date    Arthritis     Asthma     Barrett's esophagus     egd 03/2018 patel  q 3 years    Bruises easily     Cancer (CMS HCC)     SKIN CA FROM  A MOLE    Chronic idiopathic constipation     Constipation     Edema     Esophageal reflux     Hiatal hernia     IBS (irritable colon syndrome)     Osteoarthritis     Wears glasses       Allergy History as of 01/01/23       SULFA (SULFONAMIDES)         Noted Status Severity Type Reaction    12/31/22 0906 Delight Hoh, RN 07/03/16 Active Medium  Itching,  Other Adverse Reaction (Add comment), Rash    07/03/16 1438 Wayne Sever, MA 07/03/16 Active Low  Itching              TRIMETHOPRIM         Noted Status Severity Type Reaction    12/31/22 0906 Delight Hoh, RN 03/10/19 Active Medium  Rash                  I completed my transfer of care / handoff to the receiving personnel during which we discussed:  Access, Airway, All key/critical aspects of case discussed, Analgesia, Antibiotics, Expectation of post procedure, Fluids/Product, Gave opportunity for questions and acknowledgement of understanding, Labs and PMHx      Post Location: PACU                                                             Last OR Temp: Temperature: 36.5 C (97.7 F)  ABG:  POTASSIUM   Date Value Ref Range Status   12/20/2022 3.7 3.5 - 5.1 mmol/L Final     KETONES   Date Value Ref Range Status   12/20/2022 Negative Negative, Trace mg/dL Final     CALCIUM   Date Value Ref Range Status   12/20/2022 8.4 (L) 8.6 - 10.3 mg/dL Final     Airway:* No LDAs found *  Blood pressure 131/69, pulse 97, temperature 36.5 C (97.7 F), resp. rate 14, height 1.6 m (5\' 3" ), weight 45.4 kg (100 lb), SpO2 100%.

## 2023-01-02 LAB — CBC WITH DIFF
BASOPHIL #: 0 10*3/uL (ref 0.00–0.10)
BASOPHIL %: 0 % (ref 0–1)
EOSINOPHIL #: 0 10*3/uL (ref 0.00–0.50)
EOSINOPHIL %: 0 % — ABNORMAL LOW (ref 1–7)
HCT: 34.6 % (ref 31.2–41.9)
HGB: 11.3 g/dL (ref 10.9–14.3)
LYMPHOCYTE #: 0.7 10*3/uL — ABNORMAL LOW (ref 1.00–3.00)
LYMPHOCYTE %: 4 % — ABNORMAL LOW (ref 16–44)
MCH: 30.9 pg (ref 24.7–32.8)
MCHC: 32.6 g/dL (ref 32.3–35.6)
MCV: 94.7 fL (ref 75.5–95.3)
MONOCYTE #: 0.6 10*3/uL (ref 0.30–1.00)
MONOCYTE %: 4 % — ABNORMAL LOW (ref 5–13)
MPV: 9.1 fL (ref 7.9–10.8)
NEUTROPHIL #: 14.8 10*3/uL — ABNORMAL HIGH (ref 1.85–7.80)
NEUTROPHIL %: 92 % — ABNORMAL HIGH (ref 43–77)
PLATELETS: 264 10*3/uL (ref 140–440)
RBC: 3.65 10*6/uL (ref 3.63–4.92)
RDW: 12.7 % (ref 12.3–17.7)
WBC: 16.1 10*3/uL — ABNORMAL HIGH (ref 3.8–11.8)

## 2023-01-02 LAB — BASIC METABOLIC PANEL
ANION GAP: 7 mmol/L (ref 4–13)
BUN/CREA RATIO: 16 (ref 6–22)
BUN: 12 mg/dL (ref 7–25)
CALCIUM: 8.3 mg/dL — ABNORMAL LOW (ref 8.6–10.3)
CHLORIDE: 108 mmol/L — ABNORMAL HIGH (ref 98–107)
CO2 TOTAL: 23 mmol/L (ref 21–31)
CREATININE: 0.75 mg/dL (ref 0.60–1.30)
ESTIMATED GFR: 86 mL/min/{1.73_m2} (ref >59)
GLUCOSE: 177 mg/dL — ABNORMAL HIGH (ref 74–109)
OSMOLALITY, CALCULATED: 280 mosm/kg (ref 270–290)
POTASSIUM: 4 mmol/L (ref 3.5–5.1)
SODIUM: 138 mmol/L (ref 136–145)

## 2023-01-02 LAB — SCAN DIFFERENTIAL: PLATELET MORPHOLOGY COMMENT: NORMAL

## 2023-01-02 MED ORDER — ASPIRIN 81 MG CHEWABLE TABLET
81.0000 mg | CHEWABLE_TABLET | Freq: Two times a day (BID) | ORAL | Status: AC
Start: 2023-01-02 — End: 2023-02-01

## 2023-01-02 MED ORDER — HYDROCODONE 5 MG-ACETAMINOPHEN 325 MG TABLET
1.0000 | ORAL_TABLET | ORAL | 0 refills | Status: AC | PRN
Start: 2023-01-02 — End: 2023-01-09

## 2023-01-02 NOTE — Nurses Notes (Signed)
Discharge orders received, instructions given to patient. Nozin and instructions given to patient. Patient alert and oriented at time of discharge. Dressing to knee remains C,D,I. Patient assisted off unit at this time by nursing staff.

## 2023-01-02 NOTE — PT Treatment (Signed)
[facility]  [address]  [place], [address]  [phone]  (Fax) [phone]  Rehabilitation Department  Physical Therapy Daily Inpatient TKA Note    Date: 01/02/2023  Patient's Name: [name]  Date of Birth: [birth year]  Height: Height: 160 cm (5' 2.99")  Weight: Weight: 45.4 kg (100 lb 1.4 oz)      Plan: Will continue under current POC.         Subjective/Objective/Assessment:  Flowsheet    01/02/23 0842   Rehab Session   Document Type therapy progress note (daily note)   PT Visit Date 01/02/23   General Information   Patient Profile Reviewed yes   Medical Lines PIV Line   Respiratory Status room air   Existing Precautions/Restrictions fall precautions   Pre Treatment Status   Pre Treatment Patient Status Patient sitting in bedside chair or w/c   Support Present Pre Treatment  None   Communication Pre Treatment  Charge Nurse   Communication Pre Treatment Comment cleared for PT   Pre-Treatment Pain   Pretreatment Pain Rating 3/10   Pre/Posttreatment Pain Comment right knee   Transfer Assessment/Treatment   Sit-Stand Independence contact guard assist   Stand-Sit Independence contact guard assist   Sit-Stand-Sit, Assist Device walker, front wheeled   Transfer Impairments endurance;pain;strength decreased   Gait Assessment/Treatment   Total Distance Ambulated 340   Independence  stand-by assistance   Assistive Device  walker, front wheeled   Impairments  pain;endurance;strength decreased   Comment gaited 357ft no lob, good reciprocal gait   Stairs Assessment/Treatment   Number of Stairs 4   Handrail Location both sides   Independence Level stand-by assistance   Balance   Sitting Balance: Static good balance   Sitting, Dynamic (Balance) good balance   Sit-to-Stand Balance good balance   Standing Balance: Static good balance   Standing Balance: Dynamic good balance   Therapeutic Exercise   Comment patient particpated with group therapy, did well with all exs,  reviewed walking, icing, and positioning to prevent excess swelling, all questions answred for home   Post Treatment Status   Post Treatment Patient Status Patient sitting in bedside chair or w/c;Patient safety alarm activated   Support Present Post Treatment  None   Communication Post Treatement Charge Nurse   Patient Effort excellent   Post-Treatment Pain   Posttreatment Pain Rating 4/10   Cognitive Assessment/Intervention   Behavior/Mood Observations behavior appropriate to situation, WNL/WFL   RLE Assessment   Right Knee Flexion 80   Right Knee Extensor Lag 5   Physical Therapy Time and Intention   Total PT Minutes: 47   Therapy Plan Review/Discharge Plan (PT)   Anticipated Discharge Disposition home with assist;home with outpatient services     Patient in the chair, stood with cga of 1, gaited with rw 367ft good reciprocal gait, no lob, did well with all exs for home.Marland Kitchen          Physical Therapy Inpatient TKA D/C Criteria        PATIENT/CAREGIVER EDUCATION  Educated on exercise program? YES  Can successfully demonstrate exercise form? YES  Can demonstrate safe/effective assistance with functional mobility? YES     IMPAIRMENT BASED CRITERIA  Does the patient demonstrate knee ROM of at least 8-75 degrees on the involved leg? YES  Does the patient demonstrate a minimum of 3+/5 quadricep strength? YES  Does the patient demonstrate the ability to maintain any weight bearing precautions? YES  Does the patient demonstrated  sensation of all LE dermatomes? YES  Does the patient have adequate pain control of <4/10 for functional mobility at home? YES    FUNCTIONAL BASED CRITERIA  Does that patient demonstrate the ability to ambulate 80 feet with RW using CGA/SPV assistance? YES  Does the patient demonstrate the ability to perform bed mobility with no more than min assist? YES  Does the patient demonstrated the ability to sit to stand from the bed or chair with min assist? YES  Does the patient demonstrate the ability to  walk up/down 3-5 stairs with min assist as required for home entry? YES    ROM  Extension 5  Flexion 80      THERAPIST  Lane Hacker, PTA  01/02/2023, 11:59       Intervention minutes: GROUP THERAPEUTIC EXERCISE 39 MINUTES and GAIT TRAINING 8 MINUTES    THERAPIST  Lane Hacker, PTA  01/02/2023, 11:59

## 2023-01-02 NOTE — Discharge Instructions (Addendum)
Orthopaedic Discharge Instructions:    Weightbear as tolerated to the operative extremity with use of an assistive device as needed upon ambulation    Maintain surgical dressing until postoperative day 7.  May remove dressing at this time and leave incision open to air if dry.  If there is persistent drainage please cover with a gauze dressing and change daily. Please call the clinic with any erythema or purulent drainage concerning for infection.    May shower, no tub baths until follow-up     Take pain medication and blood thinners as prescribed. Attend physical therapy as instructed.    Follow-up in the orthopedics clinic at your scheduled appointment in approximately 2-3 weeks.  Please call the office to confirm your appointment.  Please also call with any questions or concerns.      [facility]  [address], [place], [place] [address]  [phone]

## 2023-01-02 NOTE — Care Management Notes (Signed)
[facility]  Care Management Initial Evaluation    Patient Name: [name]  Date of Birth: 1950-04-20  Sex: female  Date/Time of Admission: 01/01/2023  6:20 AM  Room/Bed: 379/A  Payor: HUMANA MEDICARE / Plan: Francine Graven MEDICARE ADV PEIA / Product Type: PPO /   Primary Care Providers:  Rhys Martini, PA-C (General)    Pharmacy Info:   Preferred Pharmacy       [facility] [address] - [place], [place] - [address]    [address] [place] [address]    Phone: [phone] Fax: [phone]    Hours: Not open 24 hours          Emergency Contact Info:   Extended Emergency Contact Information  Primary Emergency Contact: Camero,DOUG  Address: [address]           [place], [place] [address] [place]  Home Phone: [phone]  Work Phone: [phone]  Mobile Phone: [phone]  Relation: Husband    History:   Jacalynn Buzzell is a 72 y.o., female, admitted 01/01/23.    Height/Weight: 160 cm (5' 2.99") / 45.4 kg (100 lb 1.4 oz)     LOS: 0 days   Admitting Diagnosis: Osteoarthritis [M19.90]    Assessment:      01/02/23 1609   Assessment Details   Assessment Type Admission   Date of Care Management Update 01/02/23   Readmission   Is this a readmission? No   Insurance Information/Type   Insurance type Medicare   Employment/Financial   Patient has Prescription Coverage?  Yes        Name of Insurance Coverage for Medications [employer]   Financial Concerns none   Living Environment   Select an age group to open "lives with" row.  Adult   Lives With spouse   Living Arrangements house   Able to Return to Prior Arrangements yes   IEP and/or 504 Plan? No   Home Safety   Home Assessment: No Problems Identified   Home Accessibility stairs to enter home;stairs within home   Custody and Legal Status   Do you have a court appointed guardian/conservator? No   Are you an emancipated minor? No   Custody Issues? No   Paternity Affidavit Requested? No   Care  Management Plan   Discharge Planning Status initial meeting   Projected Discharge Date 01/02/23   Discharge plan discussed with: Patient   CM will evaluate for rehabilitation potential yes   Patient choice offered to patient/family yes   Facility or Agency Preferences [facility]   Discharge Needs Assessment   Outpatient/Agency/Support Group Needs other (see comments)  (Outpatient Physical Therapy)   Equipment Currently Used at Home commode;walker, front wheeled   Equipment Needed After Discharge none   Discharge Facility/Level of Care Needs Home (Patient/Family Member/other)(code 1)   Transportation Available car;family or friend will provide   Referral Information   Admission Type observation   Arrived From home or self-care   Home Main Entrance   Number of Stairs, Main Entrance two   Stairs Within Home, Primary   Number of Stairs, Within Home, Primary other (see comments)  (15 stairs to second floor)     Initial CM assessment completed. Pt admitted with total right knee arthroplasty. CM met with pt at bedside. Pt was alert and oriented to all spheres. Pt lives with her husband and plans to return there upon discharge. Pt has 2 steps to get into their  home and 15 steps inside of their home. Pt will have her husband, Mikaiah Stoffer, to assist in her care. Pt has a walker and a bedside commode. Pt would like to have outpatient physical therapy at [facility]. CM faxed referral to [facility] and pt is scheduled for 01/03/23 at 11:00 AM.     Discharge Plan:  Home (Patient/Family Member/other) (code 1)      The patient will continue to be evaluated for developing discharge needs.     Case Manager: Yaakov Guthrie, [place]  Phone: [phone]

## 2023-01-03 ENCOUNTER — Telehealth (INDEPENDENT_AMBULATORY_CARE_PROVIDER_SITE_OTHER): Payer: Self-pay | Admitting: Internal Medicine

## 2023-01-13 ENCOUNTER — Encounter (INDEPENDENT_AMBULATORY_CARE_PROVIDER_SITE_OTHER): Payer: Self-pay | Admitting: Internal Medicine

## 2023-01-13 ENCOUNTER — Other Ambulatory Visit: Payer: Self-pay

## 2023-01-13 ENCOUNTER — Ambulatory Visit: Payer: Self-pay | Attending: Internal Medicine | Admitting: Internal Medicine

## 2023-01-13 DIAGNOSIS — M7989 Other specified soft tissue disorders: Secondary | ICD-10-CM

## 2023-01-13 DIAGNOSIS — Z09 Encounter for follow-up examination after completed treatment for conditions other than malignant neoplasm: Secondary | ICD-10-CM

## 2023-01-13 DIAGNOSIS — Z96651 Presence of right artificial knee joint: Secondary | ICD-10-CM

## 2023-01-13 DIAGNOSIS — K5904 Chronic idiopathic constipation: Secondary | ICD-10-CM

## 2023-01-13 MED ORDER — IBUPROFEN 800 MG TABLET
800.0000 mg | ORAL_TABLET | Freq: Three times a day (TID) | ORAL | 0 refills | Status: DC | PRN
Start: 2023-01-13 — End: 2023-02-24

## 2023-01-13 NOTE — Progress Notes (Signed)
[facility]  [address]  [place] [address]  Operated by [facility]  Transitional Care Management Note    Name: [name] MRN:  [id number]   Date: 01/13/2023 Age: 72 y.o.     Chief Complaint: Hospital Follow Up [facility] follow up   fromknee surgery/) and Hospital Discharge Transition       SUBJECTIVE:  [name] is a 72 y.o. female presenting today for follow-up after being discharged. The main problem requiring admission was right knee replacement.   Co  bruising  from  buttocks down  into knee  and foot  with significant swelling.  Pt  says it is very swollen and she is having difficulty  getting around   She asked to do a telemed follow up   Pt does have fu with ortho the end of the week      Pt says she measured Half inch larger than other  leg  some better   No CP  or  SOB    Pt is on  asa  daily Pt says the leg was  this swollen  she does not feel it is worse.  Pt has been using  ice  every 2 hrs   Therapy  started  2 days after  surgery    2 x per  week   Pt is making  progress  at  [facility]   and   85 - 88 degree bend at knee.     Pt has Norco and  takes  about   1 hr   before  she goes to therapy     Bowels  have  been sluggish but  moving   qod  Pt is still on her  chronic medications and had to add veg laxative  to her  linzess  290 mg      No  GU symptoms      Pt is  up on walker and occasional  cane  Therapy  tomorrow  may graduate         OBJECTIVE:   There were no vitals taken for this visit.     Exam  Const  General: cooperative, no acute distress and alert  Resp  Effort & Inspection: able to speak in complete sentences  Psych  Mental Status: mental status grossly normal  Mood: congruent mood  Affect: normal affect  Attitude: cooperative  Insight: insight good  Judgment: judgment good     Transition of Care Contact Information  Discharge date: Discharge Date: 01/02/2023  Transition Facility Type--Hospital (Inpatient or  Observation)  Facility Name Interactive Contact(s):  Completed Contact: 01/03/2023 11:27 AM  Contact Method(s)-- Patient/Caregiver Telephone  Clinical Staff Name/Role who contacted--teresa     Data Reviewed  Medication Reconciliation completed    Assessment & Plan  Hospital discharge follow-up  Hosp  record reviewed   History of total knee replacement, right  Pt is post op and doing therapy   but she is having significant swelling   Right leg swelling  Pt to continue  heat and ice    keep fu  with  Ortho     Chronic idiopathic constipation  Continue  current regimen   Linzess  290 mg  Miralax daily  probiotic and  veg     Other transition actions (Optional) -: Discharge documentation was reviewed, Pending tests or treatments were discussed with the patient-family-caregiver , Durable medical equipment ordered at discharge was obtained, and  [employer] additional services are needed or desired    Time  with pt on phone  20 minutes     I personally offered the service to the patient, and obtained verbal consent to provide this service.    Samuella Cota, PA-C     Samuella Cota, PA-C

## 2023-01-19 NOTE — Assessment & Plan Note (Signed)
Continue  current regimen   Linzess  290 mg  Miralax daily  probiotic and  veg

## 2023-02-24 ENCOUNTER — Other Ambulatory Visit: Payer: Self-pay

## 2023-02-24 ENCOUNTER — Encounter (INDEPENDENT_AMBULATORY_CARE_PROVIDER_SITE_OTHER): Payer: Self-pay | Admitting: Internal Medicine

## 2023-02-24 ENCOUNTER — Ambulatory Visit: Payer: Medicare Other | Attending: Internal Medicine | Admitting: Internal Medicine

## 2023-02-24 VITALS — BP 100/60 | HR 77 | Ht 62.99 in

## 2023-02-24 DIAGNOSIS — E781 Pure hyperglyceridemia: Secondary | ICD-10-CM | POA: Insufficient documentation

## 2023-02-24 DIAGNOSIS — G4700 Insomnia, unspecified: Secondary | ICD-10-CM | POA: Insufficient documentation

## 2023-02-24 DIAGNOSIS — Z0000 Encounter for general adult medical examination without abnormal findings: Secondary | ICD-10-CM | POA: Insufficient documentation

## 2023-02-24 DIAGNOSIS — M25569 Pain in unspecified knee: Secondary | ICD-10-CM | POA: Insufficient documentation

## 2023-02-24 DIAGNOSIS — K581 Irritable bowel syndrome with constipation: Secondary | ICD-10-CM | POA: Insufficient documentation

## 2023-02-24 LAB — COMPREHENSIVE METABOLIC PNL, FASTING
ALBUMIN/GLOBULIN RATIO: 1.6 — ABNORMAL HIGH (ref 0.8–1.4)
ALBUMIN: 3.9 g/dL (ref 3.5–5.7)
ALKALINE PHOSPHATASE: 61 U/L (ref 34–104)
ALT (SGPT): 10 U/L (ref 7–52)
ANION GAP: 10 mmol/L (ref 4–13)
AST (SGOT): 17 U/L (ref 13–39)
BILIRUBIN TOTAL: 0.3 mg/dL (ref 0.3–1.0)
BUN/CREA RATIO: 21 (ref 6–22)
BUN: 15 mg/dL (ref 7–25)
CALCIUM, CORRECTED: 8.7 mg/dL — ABNORMAL LOW (ref 8.9–10.8)
CALCIUM: 8.6 mg/dL (ref 8.6–10.3)
CHLORIDE: 101 mmol/L (ref 98–107)
CO2 TOTAL: 30 mmol/L (ref 21–31)
CREATININE: 0.7 mg/dL (ref 0.60–1.30)
ESTIMATED GFR: 92 mL/min/{1.73_m2} (ref >59)
GLOBULIN: 2.4 (ref 2.0–3.5)
GLUCOSE: 77 mg/dL (ref 74–109)
OSMOLALITY, CALCULATED: 281 mosm/kg (ref 270–290)
POTASSIUM: 3.5 mmol/L (ref 3.5–5.1)
PROTEIN TOTAL: 6.3 g/dL — ABNORMAL LOW (ref 6.4–8.9)
SODIUM: 141 mmol/L (ref 136–145)

## 2023-02-24 LAB — CBC WITH DIFF
BASOPHIL #: 0.1 10*3/uL (ref 0.00–0.10)
BASOPHIL %: 1 % (ref 0–1)
EOSINOPHIL #: 0.2 10*3/uL (ref 0.00–0.50)
EOSINOPHIL %: 2 % (ref 1–7)
HCT: 37.7 % (ref 31.2–41.9)
HGB: 12.5 g/dL (ref 10.9–14.3)
LYMPHOCYTE #: 1.4 10*3/uL (ref 1.00–3.00)
LYMPHOCYTE %: 18 % (ref 16–44)
MCH: 31.1 pg (ref 24.7–32.8)
MCHC: 33.1 g/dL (ref 32.3–35.6)
MCV: 94 fL (ref 75.5–95.3)
MONOCYTE #: 0.7 10*3/uL (ref 0.30–1.00)
MONOCYTE %: 8 % (ref 5–13)
MPV: 9.3 fL (ref 7.9–10.8)
NEUTROPHIL #: 5.7 10*3/uL (ref 1.85–7.80)
NEUTROPHIL %: 71 % (ref 43–77)
PLATELETS: 285 10*3/uL (ref 140–440)
RBC: 4.02 10*6/uL (ref 3.63–4.92)
RDW: 13.1 % (ref 12.3–17.7)
WBC: 8.1 10*3/uL (ref 3.8–11.8)

## 2023-02-24 LAB — LIPID PANEL
CHOL/HDL RATIO: 2.1
CHOLESTEROL: 203 mg/dL — ABNORMAL HIGH (ref <200)
HDL CHOL: 96 mg/dL (ref >=40)
LDL CALC: 54 mg/dL (ref 0–100)
TRIGLYCERIDES: 265 mg/dL — ABNORMAL HIGH (ref <=150)
VLDL CALC: 53 mg/dL — ABNORMAL HIGH (ref 0–50)

## 2023-02-24 LAB — THYROID STIMULATING HORMONE (SENSITIVE TSH): TSH: 2.469 u[IU]/mL (ref 0.450–5.330)

## 2023-02-24 MED ORDER — GABAPENTIN 100 MG CAPSULE
100.0000 mg | ORAL_CAPSULE | Freq: Three times a day (TID) | ORAL | 1 refills | Status: DC
Start: 2023-02-24 — End: 2023-03-04

## 2023-02-24 MED ORDER — TRAMADOL 50 MG TABLET
1.0000 | ORAL_TABLET | Freq: Four times a day (QID) | ORAL | 1 refills | Status: DC | PRN
Start: 2023-02-24 — End: 2023-11-06

## 2023-02-24 MED ORDER — IBUPROFEN 800 MG TABLET
800.0000 mg | ORAL_TABLET | Freq: Three times a day (TID) | ORAL | 0 refills | Status: DC | PRN
Start: 2023-02-24 — End: 2023-04-28

## 2023-02-24 NOTE — Progress Notes (Signed)
INTERNAL MEDICINE, BUILDING A  [address]  [place] [address]  Operated by [facility]  Medicare Annual Wellness Visit    Name: [name] MRN:  [id number]   Date: 02/24/2023 Age: 72 y.o.       SUBJECTIVE:   [name] is a 72 y.o. female for presenting for Medicare Wellness exam.   I have reviewed and reconciled the medication list with the patient today.        02/24/2023    10:09 AM 02/18/2022     9:15 AM   Comprehensive Health Assessment-Adult   Do you wish to complete this form?  Yes   During the past 4 weeks, how would you rate your health in general? Very Good Excellent   During the past 4 weeks, how much difficulty have you had doing your usual activities inside and outside your home because of medical or emotional problems? A little bit of difficulty No difficulty at all   During the past 4 weeks, was someone available to help you if you needed and wanted help? Yes, as much as I wanted Yes, as much as I wanted   In the past year, how many times have you gone to the emergency department or been admitted to a hospital for a health problem? 1 time None   Are you generally satisfied with your sleep? Yes Yes   Do you have enough money to buy things you need in everyday life, such as food, clothing, medicines, and housing? Yes, always Yes, always   Can you get to places beyond walking distance without help?  (For example, can you drive your own car or travel alone on buses)? Yes Yes   Do you fasten your seatbelt when you are in a car? Yes, usually Yes, usually   Do you exercise 20 minutes 3 or more days per week (such as walking, dancing, biking, mowing grass, swimming)? Yes, most of the time Yes, most of the time   How often do you eat food that is healthy (fruits, vegetables, lean meats) instead of unhealthy (sweets, fast food, junk food, fatty foods)? Some of the time Most of the time   Have your parents, brothers or sisters had any of the following problems before  the age of 72? (check all that apply)  Mental health problems such as depression, bipolar, severe anxiety, postpartum depression;Diabetes (sugar);Alcohol or drug addiction (or abuse);Cancer;High cholesterol   How often do you have trouble taking medicines the eay you are told to take them? I always take them as prescribed I always take them as prescribed   Do you need any help communicating with your doctors and nurses because of vision or hearing problems? No No   During the past 12 months, have you experienced confusion or memory loss that is happening more often or is getting worse? No No   Do you have one person you think of as your personal doctor (primary care provider or family doctor)? Yes Yes   If you are seeing a Primary Care Provider (PCP) or family doctor. please list their name [facility]   Are you now also seeing any specialist physician(s) (such as eye doctor, foot doctor, skin doctor)? Yes Yes   If you are seeing a specialist for anything such as foot, eye, skin, etc.  please list their name(s) dr Lequita Halt     dr taylor   dr Courtney [place] dr Lequita Halt ((greg southers)   How confident are you  that you can control or manage most of your health problems? Very confident Very confident       I have reviewed and updated as appropriate the past medical, family and social history. 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    Chronic idiopathic constipation     Constipation     Edema     Esophageal reflux     Hiatal hernia     IBS (irritable colon syndrome)     Osteoarthritis     Wears glasses      Past Surgical History:   Procedure Laterality Date    Colonoscopy N/A     Facelift      Hx dilation and curettage N/A     Hx hip replacement Right     Hx hysterectomy      Hx knee replacment Right     Hx upper endoscopy N/A      Current  Outpatient Medications   Medication Sig    albuterol sulfate (PROVENTIL OR VENTOLIN OR PROAIR) 90 mcg/actuation Inhalation oral inhaler INHALE TWO PUFFS BY MOUTH FOUR TIMES DAILY    bethanechol chloride (URECHOLINE) 5 mg Oral Tablet Take 1 Tablet (5 mg total) by mouth Three times a day    docusate sodium (COLACE) 100 mg Oral Capsule Take 1 Capsule (100 mg total) by mouth Every evening    estradioL (ESTRACE) 2 mg Oral Tablet Take 1 Tablet (2 mg total) by mouth Once a day    famotidine (PEPCID) 40 mg Oral Tablet Take 1 Tablet (40 mg total) by mouth Every evening    gabapentin (NEURONTIN) 600 mg Oral Tablet Take 1 Tablet (600 mg total) by mouth Twice daily    Ibuprofen (MOTRIN) 800 mg Oral Tablet Take 1 Tablet (800 mg total) by mouth Three times a day as needed for Pain for up to 30 days    ipratropium bromide (ATROVENT) 42 mcg (0.06 %) Nasal Spray, Non-Aerosol Administer 2 Sprays into affected nostril(s) Three times a day as needed    Lactobacillus acidophilus (PROBIOTIC ACIDOPHILUS ORAL) Take 1 Capsule by mouth Once a day    linaCLOtide (LINZESS) 290 mcg Oral Capsule Take 1 Capsule (290 mcg total) by mouth Every morning    omeprazole (PRILOSEC) 40 mg Oral Capsule, Delayed Release(E.C.) Take 1 Capsule (40 mg total) by mouth Twice daily    ondansetron (ZOFRAN ODT) 4 mg Oral Tablet, Rapid Dissolve Take 1 Tablet (4 mg total) by mouth Every 8 hours as needed for Nausea/Vomiting    traMADoL (ULTRAM) 50 mg Oral Tablet Take 1 Tablet (50 mg total) by mouth Every 6 hours as needed for Pain     Family Medical History:       Problem Relation (Age of Onset)    Congestive Heart Failure Mother    Diabetes Sister    Uterine Cancer Sister            Social History     Socioeconomic History    Marital status: Married   Tobacco Use    Smoking status: Never    Smokeless tobacco: Never   Vaping Use    Vaping status: Never Used   Substance and Sexual Activity  Alcohol use: Not Currently    Drug use: Never     Social Determinants of  Health     Financial Resource Strain: Low Risk  (05/31/2021)    Financial Resource Strain     SDOH Financial: No   Transportation Needs: Low Risk  (05/31/2021)    Transportation Needs     SDOH Transportation: No   Social Connections: Low Risk  (01/01/2023)    Social Connections     SDOH Social Isolation: 5 or more times a week   Intimate Partner Violence: Low Risk  (05/31/2021)    Intimate Partner Violence     SDOH Domestic Violence: No   Housing Stability: Low Risk  (05/31/2021)    Housing Stability     SDOH Housing Situation: I have housing.     SDOH Housing Worry: No   Health Literacy: Low Risk  (01/01/2023)    Health Literacy     SDOH Health Literacy: Never   Employment Status: Low Risk  (05/31/2021)    Employment Status     SDOH Employment: Otherwise unemployed but not seeking work (ex. Consulting civil engineer, retired, disabled, unpaid primary care giver)         List of Current Health Care Providers   Care Team       PCP       Name Type Specialty Phone Number    Samuella Cota, [place] Physician Assistant EXTERNAL [phone]              Care Team       No care team found                      Health Maintenance   Topic Date Due    Osteoporosis screening  02/14/2023    Breast Cancer Screening  06/04/2023    Depression Screening  02/24/2024    EGD Follow Up  02/13/2025    Colonoscopy  04/12/2027    Adult Tdap-Td (2 - Td or Tdap) 05/03/2030    Influenza Vaccine  Completed    Shingles Vaccine  Completed    Covid-19 Vaccine  Completed    Medicare Annual Wellness Visit - Calendar Year Insurers  Completed    RSV Adult 60+ or Pregnancy  Completed    Pneumococcal Vaccination, Age 33+  Completed    Hepatitis C screening  Discontinued     Medicare Wellness Assessment   Medicare initial or wellness physical in the last year?: Yes  Advance Directives   Does patient have a living will or MPOA: Yes   Has patient provided [employer] with a copy?: Yes              Activities of Daily Living   Do you need help with dressing, bathing, or walking?:  No   Do you need help with shopping, housekeeping, medications, or finances?: No   Do you have rugs in hallways, broken steps, or poor lighting?: No   Do you have grab bars in your bathroom, non-slip strips in your tub, and hand rails on your stairs?: Yes   Cognitive Function Screen (1=Yes, 0=No)   What is you age?: Correct   What is the time to the nearest hour?: Correct   What is the year?: Correct   What is the name of this clinic?: Correct   Can the patient recognize two persons (the doctor, the nurse, home help, etc.)?: Correct   What is the date of your birth? (day and month sufficient) :  Correct   In what year did World War II end?: Correct   Who is the current president of the [place]?: Correct   Count from 20 down to 1?: Correct   What address did I give you earlier?: Correct   Total Score: 10   Interpretation of Total Score: Greater than 6 Normal   Fall Risk Screen   Do you feel unsteady when standing or walking?: No  Do you worry about falling?: No  Have you fallen in the past year?: No   Depression Screen     Little interest or pleasure in doing things.: Not at all  Feeling down, depressed, or hopeless: Not at all  PHQ 2 Total: 0     Pain Score        Substance Use-Abuse Screening     Tobacco Use     In Past 12 MONTHS, how often have you used any tobacco product (for example, cigarettes, e-cigarettes, cigars, pipes, or smokeless tobacco)?: Never     Alcohol use     In the PAST 12 MONTHS, how often have you had 5 (men)/4 (women) or more drinks containing alcohol in one day?: Never     Prescription Drug Use     In the PAST 12 months, how often have you used any prescription medications just for the feeling, more than prescribed, or that were not prescribed for you? Prescriptions may include: opioids, benzodiazepines, medications for ADHD: Never           Illicit Drug Use   In the PAST 12 MONTHS, how often have you used any drugs, including marijuana, cocaine or crack, heroin, methamphetamine,  hallucinogens, ecstasy/MDMA?: Never                     OBJECTIVE:   BP 100/60 (Site: Left Arm, Patient Position: Sitting)   Pulse 77   Ht 1.6 m (5' 2.99")   SpO2 98%   BMI 17.74 kg/m        Other appropriate exam:  Exam-AMB  eac  tm clear       Const  General: cooperative, healthy appearing and no acute distress  Orientation/Consciousness: patient oriented x3  HENMT  Ears: hearing grossly normal bilaterally  Eyes  General: appearance normal, both eyes and all related structures  Conjunctivae: conjunctivae normal  Sclera: sclerae normal  EOM: EOM intact bilaterally  Neck  Neck: normal visual inspection and no lymphadenopathy  Thyroid: thyroid normal  Carotids: no bruits  Resp  Effort & Inspection: normal respiratory effort  Auscultation: clear to auscultation bilaterally, no crackles, no rales, no rhonchi and no wheezes  Cardio  Jugular venous pressure: no JVD  Rate: regular rate  Rhythm: regular rhythm  Heart Sounds: S1 normal, S2 normal, no click, no murmurs and no rubs  Bruits: no carotid bruits  GI  Inspection: Yes normal to inspection  Palpation: soft, no hepatosplenomegaly, no guarding, no masses and nontender  Auscultation: normal bowel sounds  Neuro  General: patient oriented x3  Extrem  General: normal to inspection, normal exam except as noted, clubbing, cyanosis or edema noted, normal gait and other  Psych  Mental Status: mental status grossly normal  Mood: congruent mood  Affect: normal affect  Insight: insight good  Judgment: judgment good  Well healing knee scar  good ROM         Health Maintenance Due   Topic Date Due    Osteoporosis screening  02/14/2023      ASSESSMENT &  PLAN:   Assessment/Plan   1. Medicare annual wellness visit, subsequent    2. Knee pain, unspecified chronicity, unspecified laterality    3. Insomnia, unspecified type    4. Irritable bowel syndrome with constipation    5. Hypertriglyceridemia       Identified Risk Factors/ Recommended Actions       The PHQ 2 Total: 0  depression screen is interpreted as negative.      Opioid use plan of care:         Opioids use: Plan: Assessment of pain completed and pain controlled and pt still has pain but is s/p knee surgery and still doing therapy    Pt is usually not on pain meds    Orders Placed This Encounter    CBC/DIFF    COMPREHENSIVE METABOLIC PNL, FASTING    THYROID STIMULATING HORMONE (SENSITIVE TSH)    LIPID PANEL    CBC WITH DIFF    traMADoL (ULTRAM) 50 mg Oral Tablet    Ibuprofen (MOTRIN) 800 mg Oral Tablet          The patient has been educated about risk factors and recommended preventive care. Written Prevention Plan completed/ updated and given to patient (see After Visit Summary).    Meds reviewed as well as labs.  Chart reviewed and updated.   Continue current treatment.  Keep follow-up appointment.   Vaccine hx reviewed.    Samuella Cota, PA-C              INTERNAL MEDICINE, BUILDING A  [address]  [place] [address]  Operated by [facility]  Progress Note    Name: [name] MRN:  [id number]   Date: 02/24/2023 DOB:  March 07, 1951 (72 y.o.)              Chief Complaint: Medicare Annual (Medicare wellness) and Medicare Annual       HPI: [name] is a 72 y.o. female who complains of  insomnia since  her surgery on right  leg  pt wakes up with pain     Pt says   1-3 hrs max  and    Pt has mastered pt and no major pain  pt uses ibuprofen and tylenol during the day        On tramadol  and before this  Norco  and nothing helps at  night        Pt has tried every position and  therapy   no  real help   Pt says  knee  catches at times   Seeing  [facility]  and they are  till working muscles      Pt walking on cane     Pt had surgery  on  Oct   9th           Allergies:  Allergies   Allergen Reactions    Sulfa (Sulfonamides) Itching,  Other Adverse Reaction (Add comment) and Rash    Trimethoprim Rash       albuterol sulfate (PROVENTIL OR VENTOLIN OR PROAIR) 90 mcg/actuation Inhalation oral  inhaler, INHALE TWO PUFFS BY MOUTH FOUR TIMES DAILY  bethanechol chloride (URECHOLINE) 5 mg Oral Tablet, Take 1 Tablet (5 mg total) by mouth Three times a day  docusate sodium (COLACE) 100 mg Oral Capsule, Take 1 Capsule (100 mg total) by mouth Every evening  estradioL (ESTRACE) 2 mg Oral Tablet, Take 1 Tablet (2 mg total) by mouth Once a day  famotidine (PEPCID) 40 mg Oral Tablet, Take 1 Tablet (40 mg total) by mouth Every evening  ipratropium bromide (ATROVENT) 42 mcg (0.06 %) Nasal Spray, Non-Aerosol, Administer 2 Sprays into affected nostril(s) Three times a day as needed  Lactobacillus acidophilus (PROBIOTIC ACIDOPHILUS ORAL), Take 1 Capsule by mouth Once a day  linaCLOtide (LINZESS) 290 mcg Oral Capsule, Take 1 Capsule (290 mcg total) by mouth Every morning  omeprazole (PRILOSEC) 40 mg Oral Capsule, Delayed Release(E.C.), Take 1 Capsule (40 mg total) by mouth Twice daily  ondansetron (ZOFRAN ODT) 4 mg Oral Tablet, Rapid Dissolve, Take 1 Tablet (4 mg total) by mouth Every 8 hours as needed for Nausea/Vomiting  Ibuprofen (MOTRIN) 800 mg Oral Tablet, Take 1 Tablet (800 mg total) by mouth Three times a day as needed for Pain for up to 30 days  traMADoL (ULTRAM) 50 mg Oral Tablet, Take 1 Tablet (50 mg total) by mouth Every 6 hours as needed    No facility-administered medications prior to visit.       Review of Systems -     OBJECTIVE:  Vitals:    02/24/23 1006   BP: 100/60   Pulse: 77   SpO2: 98%   Height: 1.6 m (5' 2.99")      Physical Exam see above         ASSESSMENT:     ICD-10-CM    1. Medicare annual wellness visit, subsequent  Z00.00 CBC/DIFF     COMPREHENSIVE METABOLIC PNL, FASTING     THYROID STIMULATING HORMONE (SENSITIVE TSH)     LIPID PANEL      2. Knee pain, unspecified chronicity, unspecified laterality  M25.569 CBC/DIFF     COMPREHENSIVE METABOLIC PNL, FASTING     THYROID STIMULATING HORMONE (SENSITIVE TSH)     LIPID PANEL      3. Insomnia, unspecified type  G47.00 CBC/DIFF     COMPREHENSIVE  METABOLIC PNL, FASTING     THYROID STIMULATING HORMONE (SENSITIVE TSH)     LIPID PANEL      4. Irritable bowel syndrome with constipation  K58.1 CBC/DIFF     COMPREHENSIVE METABOLIC PNL, FASTING     THYROID STIMULATING HORMONE (SENSITIVE TSH)     LIPID PANEL      5. Hypertriglyceridemia  E78.1 LIPID PANEL          Orders Placed This Encounter    CBC/DIFF    COMPREHENSIVE METABOLIC PNL, FASTING    THYROID STIMULATING HORMONE (SENSITIVE TSH)    LIPID PANEL    CBC WITH DIFF    traMADoL (ULTRAM) 50 mg Oral Tablet    Ibuprofen (MOTRIN) 800 mg Oral Tablet        PLAN: Treatment per orders . Call or return to clinic prn if these symptoms worsen or fail to improve as anticipated.  Meds reviewed as well as labs.  Chart reviewed and updated.   Continue current treatment.  Keep follow-up appointment.   Add Neurontin   100 qhs  and prn   than increase   q 3 days     continue to ice    We will take over  ultram  prn   and  continue ibuprofen  tid prn   Pt has constipation and stable not but will monitor       Monitor BM's with pain on right side  no pain on exam    nuts seeds etc  discussed     Eye exam up todate    Samuella Cota,  PA-C

## 2023-02-24 NOTE — Patient Instructions (Signed)
Medicare Preventive Services  Medicare coverage information Recommendation for YOU   Heart Disease and Diabetes   Lipid profile Every 5 years or more often if at risk for cardiovascular disease     Lab Results   Component Value Date    CHOLESTEROL 218 (H) 02/18/2022    HDLCHOL 98 (H) 02/18/2022    LDLCHOL 87 02/18/2022    TRIG 167 (H) 02/18/2022           Diabetes Screening    Yearly for those at risk for diabetes, 2 tests per year for those with prediabetes Last Glucose: 177    Diabetes Self Management Training or Medical Nutrition Therapy  For those with diabetes, up to 10 hrs initial training within a year, subsequent years up to 2 hrs of follow up training Optional for those with diabetes     Medical Nutrition Therapy  Three hours of one-on-one counseling in first year, two hours in subsequent years Optional for those with diabetes, kidney disease   Intensive Behavioral Therapy for Obesity  Face-to-face counseling, first month every week, month 2-6 every other week, month 7-12 every month if continued progress is documented Optional for those with Body Mass Index 30 or higher  Your Body mass index is 17.74 kg/m.   Tobacco Cessation (Quitting) Counseling   Covers up to 8 smoking and tobacco-use cessation counseling sessions in a 24-month period.    Optional for those that use tobacco   Cancer Screening Last Completion Date   Colorectal screening   For anyone age 62 to 28 or any age if high risk:  Screening Colonoscopy every 10 yrs if low risk,  more frequent if higher risk  OR  Cologuard Stool DNA test once every 3 years OR  Fecal Occult Blood Testing yearly OR  Flexible  Sigmoidoscopy  every 5 yr OR  CT Colonography every 5 yrs    --04/11/2022  See below for due date if applicable.   Screening Pap Test   Recommended every 3 years for all women age 73 to 42, or every five years if combined with HPV test (routine screening not needed after total hysterectomy).  Medicare covers every 2 years or yearly if high  risk.  Screening Pelvic Exam   Medicare covers every 2 years, yearly if high risk or childbearing age with abnormal Pap in last 3 yrs.     See below for due date if applicable.   Screening Mammogram   Recommended every 2 years for women age 36 to 25, or more frequent if you have a higher risk. Selectively recommended for women between 40-49 based on shared decisions about risk. Covered by Medicare up to every year for women age 51 or older --06/04/2022  See below for due date if applicable.         Lung Cancer Screening  Annual low dose computed tomography (LDCT scan) is recommended for those age 41-80 who smoked 20 pack-years and are current smokers or quit smoking within past 15 years, after counseling by your doctor or nurse clinician about the possible benefits or harms.     See below for due date if applicable.   Vaccinations   Respiratory syncytial virus (RSV)  Age 64 years or older: Based on shared clinical decision-making with your provider.  Pneumococcal Vaccine  Recommended routinely age 36+ with one or two separate vaccines based on your risk. Recommended before age 24 if medical conditions with increased risk  Seasonal Influenza Vaccine  Once every flu season  Hepatitis B Vaccine  3 doses if risk (including anyone with diabetes or liver disease)  Shingles Vaccine  Two doses at age 83 or older  Diphtheria Tetanus Pertussis Vaccine  ONCE as adult, booster every 10 years     Immunization History   Administered Date(s) Administered   . Covid-19 Vaccine,Pfizer Bivalent,69mcg/0.3ml,12 yrs+ 01/10/2021, 01/17/2022   . Covid-19 Vaccine,Pfizer(Comirnaty),49mcg/0.3ml,12 yr+,Seasonal 12/18/2022   . Covid-19 Vaccine,Pfizer-BioNTech,Purple Top,34yrs+ 04/24/2019, 05/13/2019, 06/28/2020   . H1n1 Flu Vaccine Injection (Admin) 02/03/2008   . High-Dose Influenza Vaccine, 65+ 12/30/2020, 12/12/2021, 12/18/2022   . PREVNAR 13 04/22/2017, 04/22/2017, 09/23/2017     Shingles vaccine and Diphtheria Tetanus Pertussis vaccines  are available at pharmacies or local health department without a prescription.   Other Preventative Screening  Last Completion Date   Bone Densitometry   Screening: All females ages 36 and older every 10 years if initial screening normal. Postmenopausal women ages 65-64 need screening with one or more risk factor: previous fracture, parental hip fracture, current smoker, low body weight, excessive alcohol use, Rheumatoid Arthritis   For women with diagnosed Osteoporosis, follow up is recommended every 2 years or a frequency recommended by your provider.     --02/13/2021  See below for due date if applicable.     Glaucoma Screening   Yearly if in high risk group such as diabetes, family history, African American age 36+ or Hispanic American age 17+   See your eye care provider for screening.   Hepatitis C Screening   Recommended  for those born between ages 18-79 years.     See below for due date if applicable.     HIV Testing  Recommended routinely at least ONCE, covered every year for age 71 to 30 regardless of risk, and every year for age over 23 who ask for the test or higher risk. Yearly or up to 3 times in pregnancy         See below for due date if applicable.   Abdominal Aortic Aneurysm Screening Ultrasound   Once with a family history of abdominal aortic aneurysms OR a female between65-75 and have smoked at least 100 cigarettes in your lifetime.         See below for due date if applicable.       Your Personalized Schedule for Preventive Tests   Health Maintenance: Pending and Last Completed       Date Due Completion Date    Medicare Annual Wellness Visit - Calendar Year Insurers 03/25/2022 02/18/2022    Override on 02/18/2022: Done    Osteoporosis screening 02/14/2023 02/13/2021 (Prv Comp)    Override on 02/13/2021: Previously completed    Breast Cancer Screening 06/04/2023 06/04/2022    Depression Screening 02/24/2024 02/24/2023    EGD Follow Up 02/13/2025 02/13/2022    Colonoscopy 04/12/2027 04/11/2022     Adult Tdap-Td (2 - Td or Tdap) 05/03/2030 05/03/2020             Non-Opioid Treatment for Chronic Pains   Treatment for chronic pain can be managed without opioids. Below are non-opioid options that may be considered and discussed with your provider to determine which options would be best for your health.    Over the counter or presciptions medications:  Acetaminophen (Tylenol) or Non-steroidal anti-inflammatories such as: Ibuprofen (Motrin, Advil), naproxen (Aleve), aspirin  Antidepressants such as amitriptyline, nortriptyline (Pamelor),  Doxepin (Silenor), Imipramine (Tofranil) and others.  Anticonvulsant Nerve pain medications: Gabapentin (Neurontin), pregabalin (Lyrica)  Externally applied medications such as NSAID'S,  lidocaine, capsaisin, and others  Injections: pain specialists can sometimes inject medications at the site of pain.    Alternative therapies such as  Acupuncture  Osteopathic manipulation  Chiropractic  Massage therapy       For Information on Advanced Directives for Health Care:  [place]:  [email or web address]  [place], [place], [place], [place] General Information: [email or web address]

## 2023-02-28 ENCOUNTER — Ambulatory Visit (HOSPITAL_COMMUNITY): Payer: Self-pay

## 2023-03-04 ENCOUNTER — Encounter (INDEPENDENT_AMBULATORY_CARE_PROVIDER_SITE_OTHER): Payer: Self-pay | Admitting: Internal Medicine

## 2023-03-04 ENCOUNTER — Telehealth: Payer: Self-pay | Admitting: Internal Medicine

## 2023-03-04 DIAGNOSIS — M25569 Pain in unspecified knee: Secondary | ICD-10-CM

## 2023-03-04 DIAGNOSIS — G4700 Insomnia, unspecified: Secondary | ICD-10-CM

## 2023-03-04 DIAGNOSIS — G8918 Other acute postprocedural pain: Secondary | ICD-10-CM

## 2023-03-04 MED ORDER — GABAPENTIN 600 MG TABLET
600.0000 mg | ORAL_TABLET | Freq: Two times a day (BID) | ORAL | 2 refills | Status: DC
Start: 2023-03-04 — End: 2023-11-06

## 2023-03-04 NOTE — Telephone Encounter (Signed)
INTERNAL MEDICINE, BUILDING A  [address]  [place] [address]  Operated by [facility]  Telephone Visit    Name:  Antonya Carmer MRN: [id number]   Date:  03/04/2023 Age:   72 y.o.     The patient/family initiated a request for telephone service.  Verbal consent for this service was obtained from the patient/family.    Last office visit in this department: 02/24/2023      Reason for call:   Chief Complaint   Patient presents with    Medication Adjustment     Pt is on gabapentin         Allergies   Allergen Reactions    Sulfa (Sulfonamides) Itching,  Other Adverse Reaction (Add comment) and Rash    Trimethoprim Rash        albuterol sulfate (PROVENTIL OR VENTOLIN OR PROAIR) 90 mcg/actuation Inhalation oral inhaler, INHALE TWO PUFFS BY MOUTH FOUR TIMES DAILY  bethanechol chloride (URECHOLINE) 5 mg Oral Tablet, Take 1 Tablet (5 mg total) by mouth Three times a day  docusate sodium (COLACE) 100 mg Oral Capsule, Take 1 Capsule (100 mg total) by mouth Every evening  estradioL (ESTRACE) 2 mg Oral Tablet, Take 1 Tablet (2 mg total) by mouth Once a day  famotidine (PEPCID) 40 mg Oral Tablet, Take 1 Tablet (40 mg total) by mouth Every evening  gabapentin (NEURONTIN) 100 mg Oral Capsule, Take 1 Capsule (100 mg total) by mouth Three times a day  Ibuprofen (MOTRIN) 800 mg Oral Tablet, Take 1 Tablet (800 mg total) by mouth Three times a day as needed for Pain for up to 30 days  ipratropium bromide (ATROVENT) 42 mcg (0.06 %) Nasal Spray, Non-Aerosol, Administer 2 Sprays into affected nostril(s) Three times a day as needed  Lactobacillus acidophilus (PROBIOTIC ACIDOPHILUS ORAL), Take 1 Capsule by mouth Once a day  linaCLOtide (LINZESS) 290 mcg Oral Capsule, Take 1 Capsule (290 mcg total) by mouth Every morning  omeprazole (PRILOSEC) 40 mg Oral Capsule, Delayed Release(E.C.), Take 1 Capsule (40 mg total) by mouth Twice daily  ondansetron (ZOFRAN ODT) 4 mg Oral Tablet, Rapid Dissolve, Take 1 Tablet (4 mg  total) by mouth Every 8 hours as needed for Nausea/Vomiting  traMADoL (ULTRAM) 50 mg Oral Tablet, Take 1 Tablet (50 mg total) by mouth Every 6 hours as needed for Pain    No facility-administered medications prior to visit.       Call notes:  Pt called regarding medication      No diagnosis found.    PLAN: Treatment per orders . Call or return to clinic prn if these symptoms worsen or fail to improve as anticipated.  Meds reviewed as well as labs.  For now will increase to Neurontin to  600 bid  from   300 qhs  as she is still having a lot of pain associated with knee surgery  pt is in therapy and has seen ortho       Pt can not sleep much but  Neurontin is helping some      Total provider time spent with the patient on the phone: 10 minutes.    I personally offered the service to the patient, and obtained verbal consent to provide this service.    Samuella Cota, PA-C         Samuella Cota, PA-C

## 2023-04-28 ENCOUNTER — Other Ambulatory Visit (INDEPENDENT_AMBULATORY_CARE_PROVIDER_SITE_OTHER): Payer: Self-pay | Admitting: Internal Medicine

## 2023-05-02 ENCOUNTER — Other Ambulatory Visit: Payer: Self-pay

## 2023-05-02 ENCOUNTER — Ambulatory Visit: Payer: Medicare Other | Attending: Orthopaedic Surgery

## 2023-05-02 ENCOUNTER — Ambulatory Visit (HOSPITAL_COMMUNITY): Payer: Medicare Other | Admitting: Orthopaedic Surgery

## 2023-05-02 DIAGNOSIS — Z96659 Presence of unspecified artificial knee joint: Secondary | ICD-10-CM | POA: Insufficient documentation

## 2023-05-02 DIAGNOSIS — T8484XS Pain due to internal orthopedic prosthetic devices, implants and grafts, sequela: Secondary | ICD-10-CM | POA: Insufficient documentation

## 2023-05-02 DIAGNOSIS — M25461 Effusion, right knee: Secondary | ICD-10-CM | POA: Insufficient documentation

## 2023-05-02 LAB — CBC
HCT: 38.1 % (ref 31.2–41.9)
HGB: 12.6 g/dL (ref 10.9–14.3)
MCH: 30.6 pg (ref 24.7–32.8)
MCHC: 33 g/dL (ref 32.3–35.6)
MCV: 92.5 fL (ref 75.5–95.3)
MPV: 9 fL (ref 7.9–10.8)
PLATELETS: 311 10*3/uL (ref 140–440)
RBC: 4.11 10*6/uL (ref 3.63–4.92)
RDW: 13.3 % (ref 12.3–17.7)
WBC: 6.8 10*3/uL (ref 3.8–11.8)

## 2023-05-02 LAB — SYNOVIAL (JOINT) FLUID CRYSTALS

## 2023-05-02 LAB — SYNOVIAL FLUID
BODY FLUID VOLUME: 20 mL
NUCLEATED CELLS, FLUID: 1822 /uL

## 2023-05-02 LAB — SYNOVIAL FLUID DIFFERENTIAL
NEUTROPHIL %: 17 %
OTHER CELL %: 83 %

## 2023-05-02 LAB — FIBRINOGEN: FIBRINOGEN: 209 mg/dL (ref 200–400)

## 2023-05-02 LAB — C-REACTIVE PROTEIN (CRP): C-REACTIVE PROTEIN (CRP): <0.5 mg/dL (ref 0.1–0.5)

## 2023-05-02 LAB — SEDIMENTATION RATE: ERYTHROCYTE SEDIMENTATION RATE (ESR): 1 mm/h (ref <30)

## 2023-05-07 LAB — FLUID CULTURE & GRAM STAIN
GRAM STAIN: NO GROWTH
GRAM STAIN: NONE SEEN
GRAM STAIN: NONE SEEN

## 2023-05-12 ENCOUNTER — Other Ambulatory Visit: Payer: Self-pay

## 2023-05-12 ENCOUNTER — Ambulatory Visit
Admission: RE | Admit: 2023-05-12 | Discharge: 2023-05-12 | Disposition: A | Discharge status: Home / Self Care | Payer: Medicare Other | Source: Ambulatory Visit | Attending: Internal Medicine | Admitting: Internal Medicine

## 2023-05-12 DIAGNOSIS — Z13820 Encounter for screening for osteoporosis: Secondary | ICD-10-CM | POA: Insufficient documentation

## 2023-05-12 DIAGNOSIS — M810 Age-related osteoporosis without current pathological fracture: Secondary | ICD-10-CM | POA: Insufficient documentation

## 2023-05-13 ENCOUNTER — Ambulatory Visit (INDEPENDENT_AMBULATORY_CARE_PROVIDER_SITE_OTHER): Payer: Self-pay | Admitting: Internal Medicine

## 2023-05-14 ENCOUNTER — Other Ambulatory Visit (INDEPENDENT_AMBULATORY_CARE_PROVIDER_SITE_OTHER): Payer: Self-pay | Admitting: Internal Medicine

## 2023-05-14 MED ORDER — RISEDRONATE 150 MG TABLET
150.0000 mg | ORAL_TABLET | ORAL | 11 refills | Status: AC
Start: 2023-05-14 — End: 2023-06-13

## 2023-05-25 ENCOUNTER — Other Ambulatory Visit (INDEPENDENT_AMBULATORY_CARE_PROVIDER_SITE_OTHER): Payer: Self-pay | Admitting: Internal Medicine

## 2023-05-30 ENCOUNTER — Other Ambulatory Visit (RURAL_HEALTH_CENTER): Payer: Self-pay | Admitting: Internal Medicine

## 2023-06-05 ENCOUNTER — Other Ambulatory Visit: Payer: Self-pay

## 2023-06-06 ENCOUNTER — Telehealth (INDEPENDENT_AMBULATORY_CARE_PROVIDER_SITE_OTHER): Payer: Self-pay | Admitting: Internal Medicine

## 2023-06-06 DIAGNOSIS — M25569 Pain in unspecified knee: Secondary | ICD-10-CM

## 2023-06-06 DIAGNOSIS — M2560 Stiffness of unspecified joint, not elsewhere classified: Secondary | ICD-10-CM

## 2023-06-24 ENCOUNTER — Other Ambulatory Visit (INDEPENDENT_AMBULATORY_CARE_PROVIDER_SITE_OTHER): Payer: Self-pay | Admitting: Internal Medicine

## 2023-06-24 MED ORDER — BETHANECHOL CHLORIDE 5 MG TABLET
5.0000 mg | ORAL_TABLET | Freq: Two times a day (BID) | ORAL | 2 refills | Status: AC
Start: 2023-06-24 — End: ?

## 2023-06-25 ENCOUNTER — Other Ambulatory Visit (INDEPENDENT_AMBULATORY_CARE_PROVIDER_SITE_OTHER): Payer: Self-pay

## 2023-07-08 ENCOUNTER — Other Ambulatory Visit (INDEPENDENT_AMBULATORY_CARE_PROVIDER_SITE_OTHER): Payer: Self-pay | Admitting: Internal Medicine

## 2023-07-10 ENCOUNTER — Other Ambulatory Visit (INDEPENDENT_AMBULATORY_CARE_PROVIDER_SITE_OTHER): Payer: Self-pay | Admitting: Internal Medicine

## 2023-07-10 MED ORDER — FAMOTIDINE 40 MG TABLET: 40.0000 mg | ORAL_TABLET | Freq: Every evening | ORAL | 2 refills | Status: DC

## 2023-07-28 ENCOUNTER — Other Ambulatory Visit (INDEPENDENT_AMBULATORY_CARE_PROVIDER_SITE_OTHER): Payer: Self-pay | Admitting: Internal Medicine

## 2023-07-28 MED ORDER — OMEPRAZOLE 40 MG CAPSULE,DELAYED RELEASE: 40.0000 mg | DELAYED_RELEASE_CAPSULE | Freq: Two times a day (BID) | ORAL | 2 refills | Status: DC

## 2023-07-31 ENCOUNTER — Telehealth (INDEPENDENT_AMBULATORY_CARE_PROVIDER_SITE_OTHER): Payer: Self-pay | Admitting: Internal Medicine

## 2023-07-31 ENCOUNTER — Other Ambulatory Visit (INDEPENDENT_AMBULATORY_CARE_PROVIDER_SITE_OTHER): Payer: Self-pay | Admitting: Internal Medicine

## 2023-07-31 MED ORDER — CEPHALEXIN 500 MG CAPSULE
500.0000 mg | ORAL_CAPSULE | Freq: Four times a day (QID) | ORAL | 0 refills | Status: AC
Start: 2023-07-31 — End: 2023-08-07

## 2023-07-31 MED ORDER — MUPIROCIN 2 % TOPICAL OINTMENT
TOPICAL_OINTMENT | Freq: Three times a day (TID) | CUTANEOUS | 0 refills | Status: AC
Start: 2023-07-31 — End: 2023-08-10

## 2023-07-31 NOTE — Telephone Encounter (Signed)
 Per kim keflex 500 qid x 7 days and bactoban onitment cream      patient notifed

## 2023-08-24 HISTORY — PX: HX UPPER ENDOSCOPY: 2100001144

## 2023-08-25 ENCOUNTER — Ambulatory Visit (INDEPENDENT_AMBULATORY_CARE_PROVIDER_SITE_OTHER): Payer: Self-pay | Admitting: Internal Medicine

## 2023-09-09 ENCOUNTER — Other Ambulatory Visit (INDEPENDENT_AMBULATORY_CARE_PROVIDER_SITE_OTHER): Payer: Self-pay | Admitting: Internal Medicine

## 2023-09-09 MED ORDER — IBUPROFEN 800 MG TABLET
800.0000 mg | ORAL_TABLET | Freq: Three times a day (TID) | ORAL | 1 refills | Status: AC | PRN
Start: 2023-09-09 — End: ?

## 2023-09-16 ENCOUNTER — Other Ambulatory Visit (INDEPENDENT_AMBULATORY_CARE_PROVIDER_SITE_OTHER): Payer: Self-pay | Admitting: Internal Medicine

## 2023-09-16 MED ORDER — BETHANECHOL CHLORIDE 5 MG TABLET
5.0000 mg | ORAL_TABLET | Freq: Two times a day (BID) | ORAL | 2 refills | Status: DC
Start: 2023-09-16 — End: 2023-12-02

## 2023-09-19 ENCOUNTER — Ambulatory Visit: Payer: Self-pay | Admitting: Internal Medicine

## 2023-10-03 ENCOUNTER — Other Ambulatory Visit (INDEPENDENT_AMBULATORY_CARE_PROVIDER_SITE_OTHER): Payer: Self-pay | Admitting: Internal Medicine

## 2023-10-17 ENCOUNTER — Other Ambulatory Visit (HOSPITAL_COMMUNITY): Payer: Self-pay | Admitting: Orthopaedic Surgery

## 2023-10-17 DIAGNOSIS — M7631 Iliotibial band syndrome, right leg: Secondary | ICD-10-CM

## 2023-10-17 DIAGNOSIS — Z96659 Presence of unspecified artificial knee joint: Secondary | ICD-10-CM

## 2023-10-20 ENCOUNTER — Ambulatory Visit (INDEPENDENT_AMBULATORY_CARE_PROVIDER_SITE_OTHER): Admitting: Internal Medicine

## 2023-10-29 ENCOUNTER — Other Ambulatory Visit (INDEPENDENT_AMBULATORY_CARE_PROVIDER_SITE_OTHER): Payer: Self-pay | Admitting: Internal Medicine

## 2023-10-29 MED ORDER — LINACLOTIDE 290 MCG CAPSULE
290.0000 ug | ORAL_CAPSULE | Freq: Every morning | ORAL | 2 refills | Status: AC
Start: 2023-10-29 — End: ?

## 2023-11-06 ENCOUNTER — Ambulatory Visit: Attending: Internal Medicine | Admitting: Internal Medicine

## 2023-11-06 ENCOUNTER — Encounter (INDEPENDENT_AMBULATORY_CARE_PROVIDER_SITE_OTHER): Payer: Self-pay | Admitting: Internal Medicine

## 2023-11-06 ENCOUNTER — Other Ambulatory Visit: Payer: Self-pay

## 2023-11-06 VITALS — BP 110/74 | HR 79 | Ht 62.99 in | Wt 102.0 lb

## 2023-11-06 DIAGNOSIS — K219 Gastro-esophageal reflux disease without esophagitis: Secondary | ICD-10-CM | POA: Insufficient documentation

## 2023-11-06 DIAGNOSIS — R339 Retention of urine, unspecified: Secondary | ICD-10-CM | POA: Insufficient documentation

## 2023-11-06 DIAGNOSIS — K5904 Chronic idiopathic constipation: Secondary | ICD-10-CM | POA: Insufficient documentation

## 2023-11-06 DIAGNOSIS — K2270 Barrett's esophagus without dysplasia: Secondary | ICD-10-CM | POA: Insufficient documentation

## 2023-11-06 DIAGNOSIS — E785 Hyperlipidemia, unspecified: Secondary | ICD-10-CM | POA: Insufficient documentation

## 2023-11-06 DIAGNOSIS — R609 Edema, unspecified: Secondary | ICD-10-CM | POA: Insufficient documentation

## 2023-11-06 MED ORDER — LACTULOSE 10 GRAM/15 ML ORAL SOLUTION
30.0000 mL | Freq: Every day | ORAL | 1 refills | Status: AC
Start: 2023-11-06 — End: ?

## 2023-11-06 MED ORDER — BETHANECHOL CHLORIDE 25 MG TABLET
25.0000 mg | ORAL_TABLET | Freq: Three times a day (TID) | ORAL | 3 refills | Status: DC
Start: 2023-11-06 — End: 2023-12-02

## 2023-11-06 MED ORDER — FUROSEMIDE 20 MG TABLET
20.0000 mg | ORAL_TABLET | Freq: Every day | ORAL | 0 refills | Status: AC | PRN
Start: 2023-11-06 — End: ?

## 2023-11-06 NOTE — Progress Notes (Signed)
 INTERNAL MEDICINE, DELAND A  [address]  [place] [address]  Operated by [facility]      Name: [name]                       Date of Birth: Nov 26, 1950   MRN:  [id number]                         Date of visit: 11/06/2023     PCP: [name], PA-C     Subjective  [name] is a 73 y.o. year old female who presents for Follow Up 6 Months (6 month follow up    problem with BM's  having knee pain see speacilist in [place]  and ankles swelling at night)   to clinic.  No specialty comments available.   Patient Active Problem List    Diagnosis Date Noted    Osteoarthritis 01/01/2023    Asthma 05/31/2021    Barrett's esophagus 05/31/2021     egd 03/2018 patel  q 3 years      Chronic idiopathic constipation 05/31/2021    Edema 05/31/2021    Esophageal reflux 05/31/2021    Hiatal hernia 05/31/2021    IBS (irritable colon syndrome) 05/31/2021    Menopausal flushing 05/31/2021    Left carotid bruit 05/31/2021    Hypomagnesemia 05/31/2021    Vitamin D  deficiency 07/03/2016    Osteoporosis 07/03/2016      Current Outpatient Medications   Medication Sig    albuterol  sulfate (PROVENTIL  OR VENTOLIN  OR PROAIR ) 90 mcg/actuation Inhalation oral inhaler INHALE TWO PUFFS BY MOUTH FOUR TIMES DAILY    bethanechol  chloride (URECHOLINE ) 25 mg Oral Tablet Take 1 Tablet (25 mg total) by mouth Three times a day for 30 days    bethanechol  chloride (URECHOLINE ) 5 mg Oral Tablet Take 1 Tablet (5 mg total) by mouth Twice daily    docusate sodium  (COLACE) 100 mg Oral Capsule Take 1 Capsule (100 mg total) by mouth Every evening    estradioL  (ESTRACE ) 2 mg Oral Tablet TAKE ONE TABLET BY MOUTH ONCE EVERY DAY    famotidine  (PEPCID ) 40 mg Oral Tablet Take 1 Tablet (40 mg total) by mouth Every evening    furosemide  (LASIX ) 20 mg Oral Tablet Take 1 Tablet (20 mg total) by mouth Once per day as needed    Ibuprofen  (MOTRIN ) 800 mg Oral Tablet Take 1 Tablet (800 mg total) by mouth Every 8 hours  as needed for Pain    Lactobacillus acidophilus (PROBIOTIC ACIDOPHILUS ORAL) Take 1 Capsule by mouth Once a day    lactulose  (ENULOSE ) 10 gram/15 mL Oral Solution Take 30 mL by mouth Daily    linaCLOtide  (LINZESS ) 290 mcg Oral Capsule Take 1 Capsule (290 mcg total) by mouth Every morning    omeprazole  (PRILOSEC) 40 mg Oral Capsule, Delayed Release(E.C.) Take 1 Capsule (40 mg total) by mouth Twice daily    ondansetron  (ZOFRAN  ODT) 4 mg Oral Tablet, Rapid Dissolve Take 1 Tablet (4 mg total) by mouth Every 8 hours as needed for Nausea/Vomiting        Fu Visit       Fu IBS C    Pt has had  severe chronic constipation   Stool softner  daily    linzess    290  probiotic  and  colon cleanse    Vegetable laxative  prn       Pt uses above  if no good BM's in   3 days        CO urinary retention     Pt says she has constant feeling that she needs to eliminate     Pt says worse at night with urge and has to try 2-3 times     X 1 year  if symptoms        Right knee pain  constant  has been getting  cyst drained and  it helps for a week    Cyst getting  size  of golf ball  and  pt says pain gets worse   Concern she says is that her IT band is tight  and too short     Pt  to see  [facility]  Knee specialist     Surgery   was Jan 01 2023        Co ankle swelling worse right > left         Co  last few  weeks skin  itching  on  right lower leg and had  blisters on  lower  ext    Edema on and off  Pt has lasix  prn       Fu OP  pt never took the actonel  as of yet  She does not feel comfortable taking this medication   Pt and I reviewed dexa   1  score  -2.5      Co left thumb  joint laxity  Pt has no numbness tingling or burning               REVIEW OF SYSTEMS:   Review of Systems  General: No fever.  No chills.  No weight changes.  HEENT: No vision changes.  Cardiac: No chest pain. No palpitations.  No dizziness.  No light-headedness.  No near syncope.  Resp: No dyspnea at rest, no dyspnea on exertion; no cough or hemoptysis; no  orthopnea or PND.  GI: No N/V. No melena.  No bright red blood per rectum.  Ext: No edema.  No claudication.  Neuro: No focal weakness.  No numbness.  All other ROS negative.      Objective: BP 110/74 (Site: Left Arm, Patient Position: Sitting)   Pulse 79   Ht 1.6 m (5' 2.99)   Wt 46.3 kg (102 lb)   SpO2 100%   BMI 18.07 kg/m                PHYSICAL EXAM  Physical Exam  Gen: NAD. Alert.   HEENT: PERRL; conjunctivae clear. No JVD or carotid bruit.  Cardiac: RRR with normal S1, S2.   Lungs: Clear to auscultation bilaterally. No rales. No wheezing. No rhonchi.  Abdomen: Soft, non-tender.non-distended  nl bowel sounds    Extremities: No edema. No cyanosis. No clubbing.  Neurologic:  Grossly intact    Lab Results   Component Value Date    CHOLESTEROL 211 (H) 11/06/2023    HDLCHOL 101 11/06/2023    LDLCHOL 82 11/06/2023    TRIG 141 11/06/2023       COMPLETE BLOOD COUNT   Lab Results   Component Value Date    WBC 5.3 11/06/2023    HGB 12.7 11/06/2023    HCT 38.0 11/06/2023    PLTCNT 298 11/06/2023       DIFFERENTIAL  Lab Results   Component Value Date    PMNS 64 11/06/2023    LYMPHOCYTES 23 11/06/2023    MONOCYTES 10 11/06/2023  EOSINOPHIL 3 11/06/2023    BASOPHILS 0 11/06/2023    BASOPHILS 0.00 11/06/2023    PMNABS 3.40 11/06/2023    LYMPHSABS 1.20 11/06/2023    EOSABS 0.10 11/06/2023    MONOSABS 0.50 11/06/2023        COMPREHENSIVE METABOLIC PANEL  Lab Results   Component Value Date    SODIUM 140 11/06/2023    POTASSIUM 3.8 11/06/2023    CHLORIDE 108 (H) 11/06/2023    CO2 30 11/06/2023    ANIONGAP 2 (L) 11/06/2023    BUN 11 11/06/2023    CREATININE 0.79 11/06/2023    GLUCOSENF 85 11/06/2023    GLUCOSE Negative 12/20/2022    CALCIUM 8.7 11/06/2023    ALBUMIN 3.9 11/06/2023    TOTALPROTEIN 6.2 (L) 11/06/2023    ALKPHOS 56 11/06/2023    AST 22 11/06/2023    ALT 15 11/06/2023    GFR 80 11/06/2023                THYROID  STIMULATING HORMONE  Lab Results   Component Value Date    TSH 3.090 11/06/2023        Lab Results    Component Value Date    HA1C 5.3 12/20/2022     Lab Results   Component Value Date    VITD 36 02/18/2022            Assessment & Plan  Gastroesophageal reflux disease without esophagitis  Stable on Prilosec  40 mg and  Pepcid  40 mg one daily  Edema, unspecified type  Lasix  prn   Barrett's esophagus without dysplasia  EGD up to date  Dr Tobie    Q 3 yrs   Chronic idiopathic constipation  Continue  current regimen   Linzess   290 mg  Miralax daily  probiotic and  veg  Bladder retention  Pt on bethanechol  for GI   Will start bethanechol   25 mg tid     May need   Hyperlipidemia, unspecified hyperlipidemia type       Discussed new med ? Ibsrella 50 mg  will check with gi to see    Start bethanechol  25 mg tid       Lasix  prn   Labs in fu    Fu with Ortho re knee and  thumb      Meds reviewed as well as labs.  Chart reviewed and updated.   Continue current treatment.  Keep follow-up appointment.   Vaccine hx reviewed.

## 2023-11-07 ENCOUNTER — Ambulatory Visit (INDEPENDENT_AMBULATORY_CARE_PROVIDER_SITE_OTHER): Payer: Self-pay | Admitting: Internal Medicine

## 2023-11-08 NOTE — Assessment & Plan Note (Signed)
 Continue  current regimen   Linzess  290 mg  Miralax daily  probiotic and  veg

## 2023-11-08 NOTE — Assessment & Plan Note (Signed)
Stable on Prilosec  40 mg and  Pepcid 40 mg one daily

## 2023-11-08 NOTE — Assessment & Plan Note (Signed)
 EGD up to date  Dr Tobie    Q 3 yrs

## 2023-11-08 NOTE — Assessment & Plan Note (Signed)
 Lasix prn

## 2023-12-02 ENCOUNTER — Other Ambulatory Visit: Payer: Self-pay

## 2023-12-02 ENCOUNTER — Encounter (INDEPENDENT_AMBULATORY_CARE_PROVIDER_SITE_OTHER): Payer: Self-pay | Admitting: Internal Medicine

## 2023-12-02 ENCOUNTER — Ambulatory Visit: Payer: Self-pay | Attending: Internal Medicine | Admitting: Internal Medicine

## 2023-12-02 VITALS — BP 100/60 | HR 82 | Temp 96.8°F | Ht 62.0 in

## 2023-12-02 MED ORDER — BETHANECHOL CHLORIDE 25 MG TABLET
25.0000 mg | ORAL_TABLET | Freq: Three times a day (TID) | ORAL | 2 refills | Status: DC
Start: 2023-12-02 — End: 2023-12-02

## 2023-12-02 MED ORDER — BETHANECHOL CHLORIDE 50 MG TABLET: 50.0000 mg | ORAL_TABLET | Freq: Three times a day (TID) | ORAL | 1 refills | Status: DC

## 2023-12-02 NOTE — Nursing Note (Signed)
 4 wk follow up  pt states that she is feeling good pt states she still feels like she is not empting her bladder all the way

## 2023-12-02 NOTE — Progress Notes (Signed)
 INTERNAL MEDICINE, DELAND A  [address]  [place] [address]  Operated by [facility]      Name: [name]                       Date of Birth: February 24, 1951   MRN:  [id number]                         Date of visit: 12/02/2023     PCP: [name], PA-C     Subjective  [name] is a 73 y.o. year old female who presents for Follow Up (4 wk follow up  pt states that she is feeling good pt states she still feels like she is not empting her bladder all the way)   to clinic.  No specialty comments available.   Patient Active Problem List    Diagnosis Date Noted    Urinary retention 12/14/2023    Osteoarthritis 01/01/2023    Asthma 05/31/2021    Barrett's esophagus 05/31/2021     egd 03/2018 patel  q 3 years      Chronic idiopathic constipation 05/31/2021    Edema 05/31/2021    Esophageal reflux 05/31/2021    Hiatal hernia 05/31/2021    IBS (irritable colon syndrome) 05/31/2021    Menopausal flushing 05/31/2021    Left carotid bruit 05/31/2021    Hypomagnesemia 05/31/2021    Vitamin D  deficiency 07/03/2016    Osteoporosis 07/03/2016      Current Outpatient Medications   Medication Sig    albuterol  sulfate (PROVENTIL  OR VENTOLIN  OR PROAIR ) 90 mcg/actuation Inhalation oral inhaler INHALE TWO PUFFS BY MOUTH FOUR TIMES DAILY    amoxicillin -pot clavulanate (AUGMENTIN ) 875-125 mg Oral Tablet Take 1 Tablet by mouth Twice daily for 10 days    bethanechol  chloride (URECHOLINE ) 50 mg Oral Tablet Take 1 Tablet (50 mg total) by mouth Three times a day    docusate sodium  (COLACE) 100 mg Oral Capsule Take 1 Capsule (100 mg total) by mouth Every evening    estradioL  (ESTRACE ) 2 mg Oral Tablet TAKE ONE TABLET BY MOUTH ONCE EVERY DAY    famotidine  (PEPCID ) 40 mg Oral Tablet Take 1 Tablet (40 mg total) by mouth Every evening    furosemide  (LASIX ) 20 mg Oral Tablet Take 1 Tablet (20 mg total) by mouth Once per day as needed    Ibuprofen  (MOTRIN ) 800 mg Oral Tablet Take 1 Tablet (800 mg  total) by mouth Every 8 hours as needed for Pain    Lactobacillus acidophilus (PROBIOTIC ACIDOPHILUS ORAL) Take 1 Capsule by mouth Once a day    lactulose  (ENULOSE ) 10 gram/15 mL Oral Solution Take 30 mL by mouth Daily    linaCLOtide  (LINZESS ) 290 mcg Oral Capsule Take 1 Capsule (290 mcg total) by mouth Every morning    omeprazole  (PRILOSEC) 40 mg Oral Capsule, Delayed Release(E.C.) Take 1 Capsule (40 mg total) by mouth Twice daily    ondansetron  (ZOFRAN  ODT) 4 mg Oral Tablet, Rapid Dissolve Take 1 Tablet (4 mg total) by mouth Every 8 hours as needed for Nausea/Vomiting        Fu Visit   This 73 yo is here for follow up visit    CO urinary retention at last visit    Pt says she has constant feeling that she needs to eliminate     Pt says worse at night with urge and has to try 2-3 times  X 1 year  if symptoms      Pt was on bethanechol  5mg   for GI  she was than started on bethanechol  25 mg tid  and says she  thinks it helps some but not enough    Will plan to increase and get pt urology appt    Fu IBS C   BM's some better   4-5 x per week      Fu PMS  pt  is on estrace    2mg                                                                                                                                                                                  REVIEW OF SYSTEMS:   Review of Systems  General: No fever.  No chills.  No weight changes.  HEENT: No vision changes.  Cardiac: No chest pain. No palpitations.  No dizziness.  No light-headedness.  No near syncope.  Resp: No dyspnea at rest, no dyspnea on exertion; no cough or hemoptysis; no orthopnea or PND.  GI: No N/V. No melena.  No bright red blood per rectum.  Ext: No edema.  No claudication.  Neuro: No focal weakness.  No numbness.  All other ROS negative.      Objective: BP 100/60   Pulse 82   Temp 36 C (96.8 F)   Ht 1.575 m (5' 2)   SpO2 97%   BMI 18.66 kg/m                PHYSICAL EXAM  Physical Exam  Gen: NAD. Alert.   HEENT: PERRL; conjunctivae  clear. No JVD or carotid bruit.  Cardiac: RRR with normal S1, S2.   Lungs: Clear to auscultation bilaterally. No rales. No wheezing. No rhonchi.  Abdomen: Soft, non-tender.non-distended  nl bowel sounds    Extremities: No edema. No cyanosis. No clubbing.  Neurologic:  Grossly intact    Lab Results   Component Value Date    CHOLESTEROL 211 (H) 11/06/2023    HDLCHOL 101 11/06/2023    LDLCHOL 82 11/06/2023    TRIG 141 11/06/2023       COMPLETE BLOOD COUNT   Lab Results   Component Value Date    WBC 5.3 11/06/2023    HGB 12.7 11/06/2023    HCT 38.0 11/06/2023    PLTCNT 298 11/06/2023       DIFFERENTIAL  Lab Results   Component Value Date    PMNS 64 11/06/2023    LYMPHOCYTES 23 11/06/2023    MONOCYTES 10 11/06/2023    EOSINOPHIL 3 11/06/2023    BASOPHILS 0 11/06/2023    BASOPHILS 0.00 11/06/2023    PMNABS  3.40 11/06/2023    LYMPHSABS 1.20 11/06/2023    EOSABS 0.10 11/06/2023    MONOSABS 0.50 11/06/2023        COMPREHENSIVE METABOLIC PANEL  Lab Results   Component Value Date    SODIUM 140 11/06/2023    POTASSIUM 3.8 11/06/2023    CHLORIDE 108 (H) 11/06/2023    CO2 30 11/06/2023    ANIONGAP 2 (L) 11/06/2023    BUN 11 11/06/2023    CREATININE 0.79 11/06/2023    GLUCOSENF 85 11/06/2023    GLUCOSE Negative 12/20/2022    CALCIUM 8.7 11/06/2023    ALBUMIN 3.9 11/06/2023    TOTALPROTEIN 6.2 (L) 11/06/2023    ALKPHOS 56 11/06/2023    AST 22 11/06/2023    ALT 15 11/06/2023    GFR 80 11/06/2023                THYROID  STIMULATING HORMONE  Lab Results   Component Value Date    TSH 3.090 11/06/2023        Lab Results   Component Value Date    HA1C 5.3 12/20/2022     Lab Results   Component Value Date    VITD 36 02/18/2022         Orders Placed This Encounter    bethanechol  chloride (URECHOLINE ) 50 mg Oral Tablet      Assessment & Plan  Chronic idiopathic constipation    Barrett's esophagus without dysplasia    Urinary retention           Increase  bethanechol    50 mg tid  if no better will refer to urology     Labs reviewed and  discussed  meds     Meds reviewed as well as labs.  Chart reviewed and updated.   Continue current treatment.  Keep follow-up appointment.   Vaccine hx reviewed.

## 2023-12-04 ENCOUNTER — Ambulatory Visit (INDEPENDENT_AMBULATORY_CARE_PROVIDER_SITE_OTHER): Payer: Self-pay | Admitting: Internal Medicine

## 2023-12-09 ENCOUNTER — Other Ambulatory Visit: Payer: Self-pay

## 2023-12-09 ENCOUNTER — Encounter (HOSPITAL_BASED_OUTPATIENT_CLINIC_OR_DEPARTMENT_OTHER): Payer: Self-pay | Admitting: Orthopaedic Surgery

## 2023-12-09 ENCOUNTER — Ambulatory Visit (HOSPITAL_BASED_OUTPATIENT_CLINIC_OR_DEPARTMENT_OTHER)
Admission: RE | Admit: 2023-12-09 | Discharge: 2023-12-09 | Disposition: A | Discharge status: Home / Self Care | Source: Ambulatory Visit | Admitting: Radiology

## 2023-12-09 ENCOUNTER — Ambulatory Visit: Payer: Self-pay | Attending: Orthopaedic Surgery | Admitting: Orthopaedic Surgery

## 2023-12-09 VITALS — Temp 95.9°F | Ht 62.21 in | Wt 100.1 lb

## 2023-12-09 DIAGNOSIS — T8484XA Pain due to internal orthopedic prosthetic devices, implants and grafts, initial encounter: Secondary | ICD-10-CM | POA: Insufficient documentation

## 2023-12-09 DIAGNOSIS — M25561 Pain in right knee: Secondary | ICD-10-CM | POA: Insufficient documentation

## 2023-12-09 DIAGNOSIS — M25461 Effusion, right knee: Secondary | ICD-10-CM

## 2023-12-09 DIAGNOSIS — Z96651 Presence of right artificial knee joint: Secondary | ICD-10-CM | POA: Insufficient documentation

## 2023-12-09 DIAGNOSIS — Z96659 Presence of unspecified artificial knee joint: Secondary | ICD-10-CM | POA: Insufficient documentation

## 2023-12-09 DIAGNOSIS — M25861 Other specified joint disorders, right knee: Secondary | ICD-10-CM | POA: Insufficient documentation

## 2023-12-09 DIAGNOSIS — M7631 Iliotibial band syndrome, right leg: Secondary | ICD-10-CM | POA: Insufficient documentation

## 2023-12-09 NOTE — Progress Notes (Signed)
 JOINT REPLACEMENT, [place] [facility]  [address]  [place] [address]  Operated by [facility]     Name: [name] MRN:  [id number]   Date: 12/09/2023 Age: 73 y.o.         Chief Complaint:   Chief Complaint   Patient presents with    Knee Pain        HPI:  This is a 73 y.o. year old woman who presents with right knee pain and swelling  Original joint replacement: October 2024 by Dr. Joesph in [place].  The knee was progressing postoperatively, but has been experiencing persistent pain and swelling along the lateral aspect of the knee. She reports a prior knee injury a couple years before surgery and was found to have an ACL tear and meniscal tear. She tried therapy and conservative measures for this, before ultimately undergoing the total knee arthroplasty. No arthroscopy surgery history. She notes the lateral swelling preoperatively and had had it drained a couple of times. Postoperatively she has had the cystic swelling area drained and also injected with corticosteroids multiple times. This will provide relief for approximately 1 week before recurring. Swelling and pain is worse as the day goes on and with more activity. She uses Tylenol  and Ibuprofen  as needed for pain. No instability of the knee. No surgical site problems. No fevers or chills. Is also s/p a R THA approximately 9 years ago.       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    Chronic idiopathic constipation     Constipation     Edema     Esophageal reflux     Hiatal hernia     IBS (irritable colon syndrome)     Osteoarthritis     Wears glasses          PSH:  Past Surgical History:   Procedure Laterality Date    COLONOSCOPY N/A     2018 due 5 years     Jan 2024  due    5 yrs    FACELIFT      HX DILATION AND CURETTAGE N/A     HX HIP REPLACEMENT Right     fx   dr gardner in [place]    HX HYSTERECTOMY      HX KNEE REPLACMENT Right     01/01/2023  dr joesph JOY UPPER ENDOSCOPY N/A 08/2023    01/2022  patel  due in  3 yrs     08/2021  q  3 yrs         Medications:  Current Medications[1]    Allergy:  Allergies[2]    Family History:    Family Medical History:       Problem Relation (Age of Onset)    Congestive Heart Failure Mother    Diabetes Sister    Uterine Cancer Sister            Denies a family history of DVT/PE/Hypercoagulable state    Social History:   Social History     Occupational History    Not on file   Tobacco Use    Smoking status: Never    Smokeless tobacco:  Never   Vaping Use    Vaping status: Never Used   Substance and Sexual Activity    Alcohol  use: Not Currently    Drug use: Never    Sexual activity: Not on file       Occupation:  Retired    Current tobacco use:  None  Current alcohol  use:  Occasional  Recreational drug use:  None                                                                                                                                                                                      Review of Systems:  The ROS is reviewed as above    Exam:  Temp (!) 35.5 C (95.9 F)   Ht 1.58 m (5' 2.21)   Wt 45.4 kg (100 lb 1.4 oz)   BMI 18.19 kg/m     General: NAD  CV: regular rate by peripheral pulses  Respiratory: No increased work of breathing on room air    MSK Exam: RLE specific  Skin: well healed anterior midline incision without any signs of dehiscence or surrounding infection. Focal area of swelling over the lateral knee joint line  ROM: 0-115 degrees  Patellar Tracking: midline  Laxity: 1+ lateral opening, minimal medial opening with varus/valgus testing  Pain/Tenderness: tenderness along the lateral femoral condyle.  Strength: 5/5 knee extension, intact ankle DF/PF  Sensation: SILT distally  Vascular: Well perfused distally      X-rays:  Xrays obtained in the office today show cementless right total knee arthroplasty implants  with no signs of loosening or failure. Left knee with well maintained joint spaces.    Pre-operative imaging of the right knee available in our system from 08/2022 shows well maintained joint spaces of the right knee with no significant degenerative changes. Noted anterior translation of the tibia.    MRI of the right knee available in our system from 09/2022 shows absent ACL, medial meniscus tear, and overall no significant cartilage wear.       Impression:      ICD-10-CM    1. Right knee pain  M25.561 XR [facility] Knee Series,Right      2. Painful total knee replacement  T84.84XA     Z96.659       3. It band syndrome, right  M76.31        4. Hx R THA    Plan:    73 year old female approximately 11 months out from R TKA. Overall her knee is functioning without any instability, but the persistent lateral pain and swelling are her biggest concerns. Aspiration and local steroid injections of the area have  not provided persistent relief. We will obtain a MARS MRI of the right knee for further evaluation of the cystic region. She may continue to use PRN OTC analgesics and anti-inflammatories and/or topicals for symptom management. We will see her back in clinic following the MARS MRI  The patient was encouraged to call with questions or concerns.      Lonni Granville, MD  12/09/2023, 08:43  Ofc [phone]  Fax [phone]      Cc    Joesph Kocher, DO  [address]  [place],  [place] [address]    [name], PA-C  [address]  [place] [address]               [1]   Current Outpatient Medications:     albuterol  sulfate (PROVENTIL  OR VENTOLIN  OR PROAIR ) 90 mcg/actuation Inhalation oral inhaler, INHALE TWO PUFFS BY MOUTH FOUR TIMES DAILY, Disp: 54 g, Rfl: 0    bethanechol  chloride (URECHOLINE ) 50 mg Oral Tablet, Take 1 Tablet (50 mg total) by mouth Three times a day, Disp: 90 Tablet, Rfl: 1    docusate sodium  (COLACE) 100 mg Oral Capsule, Take 1 Capsule (100 mg total) by mouth  Every evening, Disp: , Rfl:     estradioL  (ESTRACE ) 2 mg Oral Tablet, TAKE ONE TABLET BY MOUTH ONCE EVERY DAY, Disp: 90 Tablet, Rfl: 2    famotidine  (PEPCID ) 40 mg Oral Tablet, Take 1 Tablet (40 mg total) by mouth Every evening, Disp: 90 Tablet, Rfl: 2    furosemide  (LASIX ) 20 mg Oral Tablet, Take 1 Tablet (20 mg total) by mouth Once per day as needed, Disp: 30 Tablet, Rfl: 0    Ibuprofen  (MOTRIN ) 800 mg Oral Tablet, Take 1 Tablet (800 mg total) by mouth Every 8 hours as needed for Pain, Disp: 90 Tablet, Rfl: 1    Lactobacillus acidophilus (PROBIOTIC ACIDOPHILUS ORAL), Take 1 Capsule by mouth Once a day, Disp: , Rfl:     lactulose  (ENULOSE ) 10 gram/15 mL Oral Solution, Take 30 mL by mouth Daily, Disp: 473 mL, Rfl: 1    linaCLOtide  (LINZESS ) 290 mcg Oral Capsule, Take 1 Capsule (290 mcg total) by mouth Every morning, Disp: 90 Capsule, Rfl: 2    omeprazole  (PRILOSEC) 40 mg Oral Capsule, Delayed Release(E.C.), Take 1 Capsule (40 mg total) by mouth Twice daily, Disp: 180 Capsule, Rfl: 2    ondansetron  (ZOFRAN  ODT) 4 mg Oral Tablet, Rapid Dissolve, Take 1 Tablet (4 mg total) by mouth Every 8 hours as needed for Nausea/Vomiting, Disp: 30 Tablet, Rfl: 0  [2]   Allergies  Allergen Reactions    Sulfa (Sulfonamides) Itching,  Other Adverse Reaction (Add comment) and Rash    Trimethoprim Rash    Keflex  [Cephalexin ] Itching

## 2023-12-10 ENCOUNTER — Encounter (HOSPITAL_BASED_OUTPATIENT_CLINIC_OR_DEPARTMENT_OTHER): Payer: Self-pay | Admitting: Student in an Organized Health Care Education/Training Program

## 2023-12-11 ENCOUNTER — Ambulatory Visit (INDEPENDENT_AMBULATORY_CARE_PROVIDER_SITE_OTHER)

## 2023-12-11 ENCOUNTER — Ambulatory Visit (HOSPITAL_BASED_OUTPATIENT_CLINIC_OR_DEPARTMENT_OTHER): Payer: Self-pay | Admitting: Orthopaedic Surgery

## 2023-12-11 ENCOUNTER — Other Ambulatory Visit: Payer: Self-pay

## 2023-12-11 VITALS — BP 157/80 | HR 66 | Temp 97.3°F | Resp 20 | Ht 62.0 in | Wt 101.4 lb

## 2023-12-11 DIAGNOSIS — Z96651 Presence of right artificial knee joint: Secondary | ICD-10-CM

## 2023-12-11 DIAGNOSIS — M25561 Pain in right knee: Secondary | ICD-10-CM

## 2023-12-11 DIAGNOSIS — S81811A Laceration without foreign body, right lower leg, initial encounter: Secondary | ICD-10-CM

## 2023-12-11 DIAGNOSIS — S51811A Laceration without foreign body of right forearm, initial encounter: Secondary | ICD-10-CM

## 2023-12-11 MED ORDER — AMOXICILLIN 875 MG-POTASSIUM CLAVULANATE 125 MG TABLET
1.0000 | ORAL_TABLET | Freq: Two times a day (BID) | ORAL | 0 refills | Status: AC
Start: 2023-12-11 — End: 2023-12-21

## 2023-12-11 NOTE — Progress Notes (Signed)
 Attending physician: Dr. Selinda   History of Present Illness: [name] is a 73 y.o. female who presents to the Urgent Care today with chief complaint of    Chief Complaint              Cut Right leg and right arm            73 y.o. female presents to the Urgent Care with c/o R arm and leg lacerations onset today. Pt reports she was watching her sons dog at a dog park when another dog came running at her. Pt states the dog jumped onto her trying to play when the dogs nails cut her R arm and R leg. Pt says she is still ambulatory with no issues. Pt notes she tried to cleanse her lacerations with alcohol  swabs and water then applied a liquid bandage. Pt states she is not on any blood thinners currently and is not on any courses of steroids. Pt denies fever.    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    Chronic idiopathic constipation     Constipation     Edema     Esophageal reflux     Hiatal hernia     IBS (irritable colon syndrome)     Osteoarthritis     Wears glasses      Past Surgical History:    Past Surgical History:   Procedure Laterality Date    COLONOSCOPY N/A     2018 due 5 years     Jan 2024  due    5 yrs    FACELIFT      HX DILATION AND CURETTAGE N/A     HX HIP REPLACEMENT Right     fx  dr gardner in [place]    HX HYSTERECTOMY      HX KNEE REPLACMENT Right     01/01/2023  dr joesph JOY UPPER ENDOSCOPY N/A 08/2023    01/2022  patel  due in  3 yrs     08/2021  q  3 yrs     Allergies:  Allergies[1]    Medications:    Current Outpatient Medications   Medication Sig    albuterol  sulfate (PROVENTIL  OR VENTOLIN  OR PROAIR ) 90 mcg/actuation Inhalation oral inhaler INHALE TWO PUFFS BY MOUTH FOUR TIMES  DAILY    amoxicillin -pot clavulanate (AUGMENTIN ) 875-125 mg Oral Tablet Take 1 Tablet by mouth Twice daily for 10 days    bethanechol  chloride (URECHOLINE ) 50 mg Oral Tablet Take 1 Tablet (50 mg total) by mouth Three times a day    docusate sodium  (COLACE) 100 mg Oral Capsule Take 1 Capsule (100 mg total) by mouth Every evening    estradioL  (ESTRACE ) 2 mg Oral Tablet TAKE ONE TABLET BY MOUTH ONCE EVERY DAY    famotidine  (PEPCID ) 40 mg Oral Tablet Take 1 Tablet (40 mg total) by mouth Every evening    furosemide  (LASIX ) 20 mg Oral Tablet Take 1 Tablet (20 mg total) by mouth Once per day as needed  Ibuprofen  (MOTRIN ) 800 mg Oral Tablet Take 1 Tablet (800 mg total) by mouth Every 8 hours as needed for Pain    Lactobacillus acidophilus (PROBIOTIC ACIDOPHILUS ORAL) Take 1 Capsule by mouth Once a day    lactulose  (ENULOSE ) 10 gram/15 mL Oral Solution Take 30 mL by mouth Daily    linaCLOtide  (LINZESS ) 290 mcg Oral Capsule Take 1 Capsule (290 mcg total) by mouth Every morning    omeprazole  (PRILOSEC) 40 mg Oral Capsule, Delayed Release(E.C.) Take 1 Capsule (40 mg total) by mouth Twice daily    ondansetron  (ZOFRAN  ODT) 4 mg Oral Tablet, Rapid Dissolve Take 1 Tablet (4 mg total) by mouth Every 8 hours as needed for Nausea/Vomiting     Social History:    Social History[2]    Family History:  Family Medical History:       Problem Relation (Age of Onset)    Congestive Heart Failure Mother    Diabetes Sister    Uterine Cancer Sister          Physical Exam:  Vital signs:   Vitals:    12/11/23 1947   BP: (!) 157/80   Pulse: 66   Resp: 20   Temp: 36.3 C (97.3 F)   TempSrc: Tympanic   SpO2: 97%   Weight: 46 kg (101 lb 6.6 oz)   Height: 1.575 m (5' 2)   BMI: 18.55     Body mass index is 18.55 kg/m. Facility age limit for growth %iles is 20 years.  No LMP recorded.    Physical Exam  Vitals and nursing note reviewed.   Constitutional:       General: She is not in acute distress.     Appearance: Normal appearance. She is  well-developed. She is not diaphoretic.   HENT:      Head: Normocephalic and atraumatic.      Right Ear: External ear normal.      Left Ear: External ear normal.   Eyes:      General:         Right eye: No discharge.         Left eye: No discharge.      Conjunctiva/sclera: Conjunctivae normal.   Cardiovascular:      Rate and Rhythm: Normal rate.   Pulmonary:      Effort: Pulmonary effort is normal. No respiratory distress.      Breath sounds: No stridor.   Musculoskeletal:         General: No tenderness. Normal range of motion.      Cervical back: Normal range of motion.   Skin:     General: Skin is warm and dry.      Findings: Laceration (Superficial skin avulsion dorsal R forearm, superficial laceration over R leg approximately 14 cm long, small superficial skin avulsion over medial lower leg) present.   Neurological:      Mental Status: She is alert and oriented to person, place, and time.   Psychiatric:         Mood and Affect: Mood normal.         Behavior: Behavior normal.          Data Reviewed:    Not applicable    Course: Condition at discharge: Good     Differential Diagnosis: Laceration vs Abrasion    Assessment:   Assessment/Plan   1. Laceration of skin of right lower leg      Plan:    Orders Placed This Encounter  amoxicillin -pot clavulanate (AUGMENTIN ) 875-125 mg Oral Tablet     Prescription(s) given for Augmentin , given directions on proper use.  Weighed pros and cons of leaving laceration as is and starting Abx vs opening lac for large volume irrigation and re-closing. Given duration between injury and evaluation, will opt for leaving laceration as is.  Tetanus status is up to date  Wound Care instructions were provided in clinic, such as keeping wound clean and dry.  Educated to observe for any signs and symptoms of infection along with septic arthritis in artifical knee including, but not limited to fever, drainage, and/or red streaks.  Discussed strict ED precautions, pt is agreeable.  Advised  to follow up with PCP in th next week.    Go to Emergency Department immediately for further work up if any concerning symptoms.  Plan was discussed and patient verbalized understanding.  If symptoms are worsening or not improving the patient should return to the Urgent Care for further evaluation.    The supervising physician was physically present and available for consultation, and did not physically see the patient.    I am scribing for, and in the presence of, Camellia Marina, FNP-C for services provided on 12/11/2023.  Marolyn Lemmings, SCRIBE 12/11/2023 19:55    I personally performed the services described in this documentation, as scribed  in my presence, and it is both accurate  and complete.    Camellia Marina, FNP-C         [1]   Allergies  Allergen Reactions    Sulfa (Sulfonamides) Itching,  Other Adverse Reaction (Add comment) and Rash    Trimethoprim Rash    Keflex  [Cephalexin ] Itching   [2]   Social History  Tobacco Use    Smoking status: Never    Smokeless tobacco: Never   Vaping Use    Vaping status: Never Used   Substance Use Topics    Alcohol  use: Not Currently    Drug use: Never

## 2023-12-11 NOTE — Nursing Note (Signed)
 Patient has given verbal permission for the scribe to assist the healthcare provider during the patient's visit.Andrea Jama Dart, RN 12/11/2023, 19:51

## 2023-12-12 ENCOUNTER — Telehealth (INDEPENDENT_AMBULATORY_CARE_PROVIDER_SITE_OTHER): Payer: Self-pay | Admitting: Internal Medicine

## 2023-12-12 NOTE — Telephone Encounter (Signed)
 Post Ed Follow-Up    Post ED Follow-Up:   Document completed and/or attempted interactive contact(s) after transition to home after emergency department stay.:   Transition Facility and relevant Date:   Discharge Date: 12/11/23  Discharge from [facility] Emergency Department?: Yes  Contacted by: rhoda Pass method: Patient/Caregiver Telephone  Contact completed: 12/12/2023  8:50 AM  Contact first attempt: 12/12/2023  8:50 AM  MyChart message sent?: No  Was the AVS reviewed with patient?: No  Did the patient attempt to reach their PCP prior to going to the ED?: No  How is the patient recovering?: Improving  Medications prescribed: Yes  Were they obtained?: Yes  Interventions: No needs identified  Pt had a dog jump on her and cut her leg open

## 2023-12-13 NOTE — Progress Notes (Incomplete)
 [facility]  [address]  [place] [address]  Operated by [facility]      Name: [name]                       Date of Birth: [birth year]   MRN:  [id number]                         Date of visit: 12/02/2023     PCP: [name], PA-C     Subjective  [name] is a 73 y.o. year old female who presents for Follow Up (4 wk follow up  pt states that she is feeling good pt states she still feels like she is not empting her bladder all the way)   to clinic.  No specialty comments available.   Patient Active Problem List    Diagnosis Date Noted   . Osteoarthritis 01/01/2023   . Asthma 05/31/2021   . Barrett's esophagus 05/31/2021     egd 03/2018 patel  q 3 years     . Chronic idiopathic constipation 05/31/2021   . Edema 05/31/2021   . Esophageal reflux 05/31/2021   . Hiatal hernia 05/31/2021   . IBS (irritable colon syndrome) 05/31/2021   . Menopausal flushing 05/31/2021   . Left carotid bruit 05/31/2021   . Hypomagnesemia 05/31/2021   . Vitamin D  deficiency 07/03/2016   . Osteoporosis 07/03/2016      Current Outpatient Medications   Medication Sig   . albuterol  sulfate (PROVENTIL  OR VENTOLIN  OR PROAIR ) 90 mcg/actuation Inhalation oral inhaler INHALE TWO PUFFS BY MOUTH FOUR TIMES DAILY   . bethanechol  chloride (URECHOLINE ) 50 mg Oral Tablet Take 1 Tablet (50 mg total) by mouth Three times a day   . docusate sodium  (COLACE) 100 mg Oral Capsule Take 1 Capsule (100 mg total) by mouth Every evening   . estradioL  (ESTRACE ) 2 mg Oral Tablet TAKE ONE TABLET BY MOUTH ONCE EVERY DAY   . famotidine  (PEPCID ) 40 mg Oral Tablet Take 1 Tablet (40 mg total) by mouth Every evening   . furosemide  (LASIX ) 20 mg Oral Tablet Take 1 Tablet (20 mg total) by mouth Once per day as needed   . Ibuprofen  (MOTRIN ) 800 mg Oral Tablet Take 1 Tablet (800 mg total) by mouth Every 8 hours as needed for Pain   . Lactobacillus acidophilus (PROBIOTIC ACIDOPHILUS ORAL) Take 1 Capsule  by mouth Once a day   . lactulose  (ENULOSE ) 10 gram/15 mL Oral Solution Take 30 mL by mouth Daily   . linaCLOtide  (LINZESS ) 290 mcg Oral Capsule Take 1 Capsule (290 mcg total) by mouth Every morning   . omeprazole  (PRILOSEC) 40 mg Oral Capsule, Delayed Release(E.C.) Take 1 Capsule (40 mg total) by mouth Twice daily   . ondansetron  (ZOFRAN  ODT) 4 mg Oral Tablet, Rapid Dissolve Take 1 Tablet (4 mg total) by mouth Every 8 hours as needed for Nausea/Vomiting        Fu Visit   This 73 yo is here for follow up visit    CO urinary retention at last visit    Pt says she has constant feeling that she needs to eliminate     Pt says worse at night with urge and has to try 2-3 times     X 1 year  if symptoms      Pt was on bethanechol  5mg   for GI  she was  than started on bethanechol  tid  and says she  kkkkkkkkkkkkkkkkkkkkkkkkkkkkkkkkkkkkkkkkkkkkkkkkkkkkkkkkkkkkkkkkkkkkkkkkkkkkkkkkkkkkkkkkkkkkkkkkkkkkkkkkkkkkkkkkkkkkkkkkkkkkkkkkkkkkkkkkkkkkkkkkkkkkkkkkkkkkkkkkkkkkkkkkkkkkkkkkkkkkkkkkkkkkkkkkkkkkkkkkkkkkkkkkkkkkkkkkkkkkkkkkkkkkkkkkkkkkkkkkkkkkkkkkkkkkkkkkkkkkkkkkkkkkkkkkkkkkkkkkkkkkkkkkkkkkkkkkkkkkkkkkkkkkkkkkkkkkkkkkkkkkkkkkkkkkkkkkkkkkkkkkkkkkkkkkkkkkkkkkkkkkkkkkkkkkkkkkkkkkkkkkkkkkkkkkkkkkkkkkkkkkkkkkkkkkkkkkkkkkkkkkkkkkkkkkkkkkkkkkkkkkkkkkkkkkkkkkkkkkkkkkkkkkkkkkkkkkkkkkkkkkkkkkkkkkkkkkkkkkkkkkkkkkkkkkkkkkkkkkkkkkkkkkkkkkkkkkkkkkkkkkkkkkkkkkkkkkkkkkkkkkkkkkkkkkkkkkkkkkkkkkkkkkkkkkkkkkkkkkkkkkkkkkkkkkkkkkkkkkkkkkkkkkkkkkkkkkkkkkkkkkkkkkkkkkkkkkkkkkkkkkkkkkkkkkkkkkkkkkkkkkkkkkkkkkkkkkkkkkkkkkkkkkkkkkkkkkkkkkkkkkkkkkkkkkkkkkkkkkkkkkkkkkkkkkkkkkkkkkkkkkkkkkkkkkkkkkkkkkkkkkkkkkkkkkkkkkkkkkkkkkkkkkkkkkkkkkkkkkkkkkkkkkkkkkkkkkkkkkkkkkkkkkkkkkkkkkkkkkkkkkkkkkkkkkkkkkkkkkkkkkkkkkkkkkkkkkkkkkkkkkkkkkkkkkkkkkkkkkkkkkkkkkkkkkkkkkkkkkkkkkkkkkkkkkkkkkkkkkkkkkkkkkkkkkkkkkkkkkkkkkkkkkkkkkkkkkkkkkkkkkkkkkkkkkkkkkkkkkkkkkkkkkkkkkkkkkkkkkkkkkkkkkkkkkkkkkkkkkkkkkkkkkkkkkkkkkkkkkkkkkkkkkkkkkkkkkkkkkkkkkkkkkkkkkkkkkkkkkkkkkkkkkkkkkkkkkkkkkkkkkkkkkkkkkkkkkkkkkkkkkkkkkkkkkkkkkkkkkkkkkkkkkkkkkkkkkkkkkkkkkkkkkkkkkkkkkkkkkkkkkkkkkkkkkkkkkkkkkkkkkkkkkkkkkkk  kkkkkkkkkkkkkkkkkkkkkkkkkkkkkkkkkkkkkkkkkkkkkkkkkkkkkkkkkkkkkkkkkkkkkkkkkkkkkkkkkkkkkkkkkkkkkkkkkkkkkkkkkkkkkkkkkkkkkkkkkkkkkkkkkkkkkkkkkkkkkkkkkkkkkkkkkkkkkkkkkkkkkkkkkkkkkkkkkkkkkkkkkkkkkkkkkkkkkkkkkkkkkkkkkkkkkkkkkkkkkkkkkkkkkkkkkkkkkkkkkkkkkkkkkkkkkkkkkkkkkkkkkkkkkkkkkkkkkkkkkkkkkkkkkkkkkkkkkkkkkkkkkkkkkkkkkkkkkkkkkkkkkkkkkkkkkkkkkkkkkkkkkkkkkkkkkkkkkkkkkkkkkkkkkkkkkkkkkkkkkkkkkkkkkkkkkkkkkkkkkkkkkkkkkkkkkkkkkkkkkkkkkkkkkkkkkkkkkkkkkkkkkkkkkkkkkkkkkkkkkkkkkkkkkkkkkkkkkkkkkkkkkkkkkkkkkkkkkkkkkkkkkkkkkkkkkkkkkkkkkkkkkkkkkkkkkkkkkkkkkkkkkkkkkkkkkkkkkkkkkkkkkkkkkkkkkkkkkkkkkkkkkkkkkkkkkkkkkkkkkkkkkkkkkkkkkkkkkkkkkkkkkkkkkkkkkkkkkkkkkkkkkkkkkkkkkkkkkkkkkkkkkkkkkkkkkkkkkkkkkkkkkkkkkkkkkkkkkkkkkkkkkkkkkkkkkkkkkkkkkkkkkkkkkkkkkkkkkkkkkkkkkkkkkkkkkkkkkkkkkkkkkkkkkkkkkkkkkkkkkkkkkkkkkkkkkkkkkkkkkkkkkkkkkkkkkkkkkkkkkkkkkkkkkkkkkkkkkkkkkkkkkkkkkkkkkkkkkkkkkkkkkkkkkkkkkkkkkkkkkkkkkkkkkkkkkkkkkkkkkkkkkkkkkkkkkkkkkkkkkkkkkkkkkkkkkkkkkkkkkkkkkkkkkkkkkkkkkkkkkkkkkkkkkkkkkkkkkkkkkkkkkkkkkkkkkkkkkkkkkkkkkkkkkkkkkkkkkkkkkkkkkkkkkkkkkkkkkkkkkkkkkkkkkkkkkkkkkkkkkkkkkkkkkkkkkkkkkkkkkkkkkkkkkkkkkkkkkkkkkkkkkkkkkkkkkkkkkkkkkkkkkkkkkkkkkkkkkkkkkkkkkkkkkkkkkkkkkkkkkkkkkkkkkkkkkkkkkkkkkkkkkkkkkkkkkkkkkkkkkkkkkkkkkkkkkkkkkkkkkkkkkkkkkkkkkkkkkkkkkkkkkkkkkkkkkkkkkkkkkkkkkkkkkkkkkkkkkkkkkkkkkkkkkkkkkkkkkkkkkkkkkkkkkkkkkkkkkkkkkkkkkkkkkkkkkkkkkkkkkkkkkkkkkkkkkkkkkkkkkkkkkkkkkkkkkkkkkkkkkkkkkkkkkkkkkkkkkkkkkkkkkkkkkkkkkkkkkkkkkkkkkkkkkkkkkkkkkkkkkkkkkkkkkkkkkkkkkkkkkkkkkkkkkkkkkkkkkkkkkkkkkkkkkkkkkkkkkkkkkkkkkkkkkkkkkkkkkkkkkkkkkkkkkkkkkkkkkkkkkkkkkkkkkkkkkkkkkkkkkkkkkkkkkkkkkkkkkkkkkkkkkkkkkkkkkkkkkkkkkkkkkkkkkkkkkkkkkkkkkkkkkkkkkkkkkkkkkkkkkkkkkkkkkkkkkkkkkkkkkkkkkkkkkkkkkkkkkkkkkkkkkkkkkkkkkkkkkkkkkkkkkkkkkkkkkkkkkkkkkkkkkkkkkkkkkkkkkkkkkkkkkkkkkkkkkkkkkkkkkkkkkkkkkkkkkkkkkkkkkkkkkkkkkkkkkkkkkkkkkkkkkkkkkkkkkkkkkkkkkkkkkkkkkkkkkkkkkkkkkkkkkkkkkkkkkkkkkkkkkkkkkkkkkkkkkkkkkkkkkkkkkkkkkkkkkkkkkkkkkkkkkkkkkkkkkkkkkkkkkkkkkkkkkkkkkkkkkkkkkkkkkkkkkk kkkkkkkkkkkkkkkkkkkkkkkkkkkkkkkkkkkkkkkkkkkkkkkkkkkkkkkkkkkkkkkkkkkkkkkkkkkkkkkkkkkkkkkkkkkkkkkkkkkkkkkkkkkkkkkkkkkkkkkkkkkkkkkkkkkkkkkkkkkkkkkkkkkkkkkkkkkkkkkkkkkkkkkkkkkkkkkkkkkkkkkkkkkkkkkkkkkkkkkkkkkkkkkkkkkkkkkkkkkkkkkkkkkkkkkkkkkkkkkkkkkkkkkkkkkkkkkkkkkkkkkkkkkkkkkkkkkkkkkkkkkkkkkkkkkkkkkkkkkkkkkkkkkkkkkkkkkkkkkkkkkkkkkkkkkkkkkkkkkkkkkkkkkkkkkkkkkkkkkkkkkkkkkkkkkkkkkkkkkkkkkkkkkkkkkkkkkkkkkkkkkkkkkkkkkkkkkkkkkkkkkkkkkkkkkkkkkkkkkkkkkkkkkkkkkkkkkkkkkkkkkkkkkkkkkkkkkkkkkkkkkkkkkkkkkkkkkkkkkkkkkkkkkkkkkkkkkkkkkkkkkkkkkkkkkkkkkkkkkkkkkkkkkkkkkkkkkkkkkkkkkkkkkkkkkkkkkkkkkkkkkkkkkkkkkkkkkkkkkkkkkkkkkkkkkkkkkkkkkkkkkkkkkkkkkkkkkkkkkkkkkkkkkkkkkkkkkkkkkkkkkkkkkkkkkkkkkkkkkkkkkkkkkkkkkkkkkkkkkkkkkkkkkkkkkkkkkkkkkkkkkkkkkkkkkkkkkkkkkkkkkkkkkkkkkkkkkkkkkkkkkkkkkkkkkkkkkkkkkkkkkkkkkkkkkkkkkkkkkkkkkkkkkkkkkkkkkkkkkkkkkkkkkkkkkkkkkkkkkkkkkkkkkkkkkkkkkkkkkkkkkkkkkkkkkkkkkkkkkkkkkkkkkkkkkkkkkkkkkkkkkkkkkkkkkkkkkkkkkkkkkkkkkkkkkkkkkkkkkkkkkkkkkkkkkkkkkkkkkkkkkkkkkkkkkkkkkkkkkkkkkkkkkkkkkkkkkkkkkkkkkkkkkkkkkkkkkkkkkkkkkkkkkkkkkkkkkkkkkkkkkkkkkkkkkkkkkkkkkkkkkkkkkkkkkkkkkkkkkkkkkkkkkkkkkkkkkkkkkkkkkkkkkkkkkkkkkkkkkkkkkkkkkkkkkkkkkkkkkkkkkkkkkkkkkkkkkkkkkkkkkkkkkkkkkkkkkkkkkkkkkkkkkkkkkkkkkkkkkkkkkkkkkkkkkkkkkkkkkkkkkkkkkkkkkkkkkkkkkkkkkkkkkkkkkkkkkkkkkkkkkkkkkkkkkkkkkkkkkkkkkkkkkkkkkkkkkkkkkkkkkkkkkkkkkkkkkkkkkkkkkkkkkkkkkkkkkkkkkkkkkkkkkkkkkkkkkkkkkkkkkkkkkkkkkkkkkkkkkkkkkkkkkkkkkkkkkkkkkkkkkkkkkkkkkkkkkkkkkkkkkkkkkkkkkkkkkkkkkkkkkkkkkkkkkkkkkkkkkkkkkkkkkkkkkkkkkkkkkkkkkkkkkkkkkkkkkkkkkkkkkkkkkkkkkkkkkkkkkkkkkkkkkkkkkkkkkkkkkkkkkkkkkkkkkkkkkkkkkkkkkkkkkkkkkkkkkkkkkkkkkkkkkkkkkkkkkkkkkkkkkkkkkkkkkkkkkkkkkkkkkkkkkkkkkkkkkkkkkkkkkkkkkkkkkkkkkkkkkkkkkkkkkkkkkkkkkkkkkkkkkkkkkkkkkkkkkkkkkkkkkkkkkkkkkkkkkkkkkkkkkkkkkkkkkkkkkkkkkkkkkkkkkkkkkkkkkkk                                                                                                                                                                                      REVIEW OF  SYSTEMS:   Review of Systems  General: No fever.  No chills.  No weight changes.  HEENT: No vision changes.  Cardiac: No chest pain. No palpitations.  No dizziness.  No light-headedness.  No near syncope.  Resp: No dyspnea at rest, no dyspnea on exertion; no cough or hemoptysis; no orthopnea or PND.  GI: No N/V. No melena.  No bright red blood per rectum.  Ext: No edema.  No claudication.  Neuro: No focal weakness.  No numbness.  All other ROS negative.      Objective: BP 100/60   Pulse 82   Temp 36 C (96.8 F)   Ht 1.575 m (5' 2)   SpO2 97%   BMI 18.66 kg/m                PHYSICAL EXAM  Physical Exam  Gen: NAD. Alert.   HEENT: PERRL; conjunctivae clear. No JVD or carotid bruit.  Cardiac: RRR with normal S1, S2.   Lungs: Clear to auscultation bilaterally. No rales. No wheezing. No rhonchi.  Abdomen: Soft, non-tender.non-distended  nl bowel sounds    Extremities: No edema. No cyanosis. No clubbing.  Neurologic:  Grossly intact    Lab Results   Component Value Date    CHOLESTEROL 211 (H) 11/06/2023    HDLCHOL 101 11/06/2023    LDLCHOL 82 11/06/2023    TRIG 141 11/06/2023       COMPLETE BLOOD COUNT   Lab Results   Component Value Date    WBC 5.3 11/06/2023    HGB 12.7 11/06/2023    HCT 38.0 11/06/2023    PLTCNT 298 11/06/2023       DIFFERENTIAL  Lab Results   Component Value Date    PMNS 64 11/06/2023    LYMPHOCYTES 23 11/06/2023    MONOCYTES 10 11/06/2023    EOSINOPHIL 3 11/06/2023    BASOPHILS 0 11/06/2023    BASOPHILS 0.00 11/06/2023    PMNABS 3.40 11/06/2023    LYMPHSABS 1.20 11/06/2023    EOSABS 0.10 11/06/2023    MONOSABS 0.50 11/06/2023        COMPREHENSIVE METABOLIC PANEL  Lab Results   Component Value Date    SODIUM 140 11/06/2023    POTASSIUM 3.8 11/06/2023    CHLORIDE 108 (H) 11/06/2023    CO2 30 11/06/2023    ANIONGAP  2 (L) 11/06/2023    BUN 11 11/06/2023    CREATININE 0.79 11/06/2023    GLUCOSENF 85 11/06/2023    GLUCOSE Negative 12/20/2022    CALCIUM 8.7 11/06/2023    ALBUMIN 3.9 11/06/2023     TOTALPROTEIN 6.2 (L) 11/06/2023    ALKPHOS 56 11/06/2023    AST 22 11/06/2023    ALT 15 11/06/2023    GFR 80 11/06/2023                THYROID  STIMULATING HORMONE  Lab Results   Component Value Date    TSH 3.090 11/06/2023        Lab Results   Component Value Date    HA1C 5.3 12/20/2022     Lab Results   Component Value Date    VITD 36 02/18/2022         Orders Placed This Encounter   . bethanechol  chloride (URECHOLINE ) 50 mg Oral Tablet      Assessment & Plan                     Meds reviewed as well as labs.  Chart reviewed and updated.   Continue current treatment.  Keep follow-up appointment.   Vaccine hx reviewed.

## 2023-12-16 ENCOUNTER — Other Ambulatory Visit: Payer: Self-pay

## 2023-12-16 ENCOUNTER — Ambulatory Visit: Attending: Internal Medicine | Admitting: Internal Medicine

## 2023-12-16 VITALS — HR 80 | Temp 98.1°F

## 2023-12-16 DIAGNOSIS — R339 Retention of urine, unspecified: Secondary | ICD-10-CM | POA: Insufficient documentation

## 2023-12-16 DIAGNOSIS — S81819A Laceration without foreign body, unspecified lower leg, initial encounter: Secondary | ICD-10-CM | POA: Insufficient documentation

## 2023-12-16 DIAGNOSIS — T148XXA Other injury of unspecified body region, initial encounter: Secondary | ICD-10-CM | POA: Insufficient documentation

## 2023-12-16 NOTE — Progress Notes (Signed)
 [facility]  [address]  [place] [address]  Operated by [facility]  Progress Note    Name: [name] MRN:  [id number]   Date: 12/16/2023 DOB:  06-Jan-1951 (73 y.o.)              Chief Complaint: Cut (Right lower leg dog scratch   happened last thrusday)       HPI: [name] is a 73 y.o. female who complains of  dog scratch on  right leg  and had to be seen at urgent care and  now on Augmentin    875 bid  since Thursday   Pt says the area is much improved      Co knee pain pt did go see a specialist and consult reviewed  Pt is to have MARS MRI  she does not know when   Pt is due for drainage and injection in knee.      Fu urinary retention  pt on bethanechol  tid  awaiting Urology/gynecology appt for further evaluation     Allergies:  Allergies[1]    albuterol  sulfate (PROVENTIL  OR VENTOLIN  OR PROAIR ) 90 mcg/actuation Inhalation oral inhaler, INHALE TWO PUFFS BY MOUTH FOUR TIMES DAILY  amoxicillin -pot clavulanate (AUGMENTIN ) 875-125 mg Oral Tablet, Take 1 Tablet by mouth Twice daily for 10 days  bethanechol  chloride (URECHOLINE ) 50 mg Oral Tablet, Take 1 Tablet (50 mg total) by mouth Three times a day  docusate sodium  (COLACE) 100 mg Oral Capsule, Take 1 Capsule (100 mg total) by mouth Every evening  estradioL  (ESTRACE ) 2 mg Oral Tablet, TAKE ONE TABLET BY MOUTH ONCE EVERY DAY  famotidine  (PEPCID ) 40 mg Oral Tablet, Take 1 Tablet (40 mg total) by mouth Every evening  furosemide  (LASIX ) 20 mg Oral Tablet, Take 1 Tablet (20 mg total) by mouth Once per day as needed  Ibuprofen  (MOTRIN ) 800 mg Oral Tablet, Take 1 Tablet (800 mg total) by mouth Every 8 hours as needed for Pain  Lactobacillus acidophilus (PROBIOTIC ACIDOPHILUS ORAL), Take 1 Capsule by mouth Once a day  lactulose  (ENULOSE ) 10 gram/15 mL Oral Solution, Take 30 mL by mouth Daily  linaCLOtide  (LINZESS ) 290 mcg Oral Capsule, Take 1 Capsule (290 mcg total) by mouth Every morning  omeprazole   (PRILOSEC) 40 mg Oral Capsule, Delayed Release(E.C.), Take 1 Capsule (40 mg total) by mouth Twice daily  ondansetron  (ZOFRAN  ODT) 4 mg Oral Tablet, Rapid Dissolve, Take 1 Tablet (4 mg total) by mouth Every 8 hours as needed for Nausea/Vomiting    No facility-administered medications prior to visit.       Review of Systems -     OBJECTIVE:  Vitals:    12/16/23 0900   Pulse: 80   Temp: 36.7 C (98.1 F)   SpO2: 100%      Physical Exam   Alert and oriented   Right lower ext  lateral abrasion  down front of leg scabbed and healing  no warmth or erythema   Swelling lateral knee  no erythema      ASSESSMENT:     ICD-10-CM    1. Laceration of leg  S81.819A       2. Abrasion  T14.8XXA       3. Urinary retention  R33.9 Referral to External Provider (AMB)          Orders Placed This Encounter    Referral to External Provider (AMB)        PLAN: Treatment per orders . Call or  return to clinic prn if these symptoms worsen or fail to improve as anticipated.  Meds reviewed as well as labs.  Going for  knee drainage  with  Dr Joesph  and to have fu    Kt tape discussed    Seen by Dr Fernande  for right knee and pt to have MARS MRI    [facility] Augmentin    [facility] Urogynecology referral made      Courtney DELENA Asa, PA-C           [1]   Allergies  Allergen Reactions    Sulfa (Sulfonamides) Itching,  Other Adverse Reaction (Add comment) and Rash    Trimethoprim Rash    Keflex  [Cephalexin ] Itching

## 2023-12-17 ENCOUNTER — Encounter (INDEPENDENT_AMBULATORY_CARE_PROVIDER_SITE_OTHER): Payer: Self-pay | Admitting: Internal Medicine

## 2023-12-17 ENCOUNTER — Ambulatory Visit (INDEPENDENT_AMBULATORY_CARE_PROVIDER_SITE_OTHER): Payer: Self-pay | Admitting: Internal Medicine

## 2023-12-23 ENCOUNTER — Other Ambulatory Visit (INDEPENDENT_AMBULATORY_CARE_PROVIDER_SITE_OTHER): Payer: Self-pay

## 2024-01-01 ENCOUNTER — Telehealth (INDEPENDENT_AMBULATORY_CARE_PROVIDER_SITE_OTHER): Payer: Self-pay | Admitting: Internal Medicine

## 2024-01-12 ENCOUNTER — Telehealth (INDEPENDENT_AMBULATORY_CARE_PROVIDER_SITE_OTHER): Payer: Self-pay | Admitting: Internal Medicine

## 2024-01-12 MED ORDER — FLUCONAZOLE 150 MG TABLET
150.0000 mg | ORAL_TABLET | Freq: Once | ORAL | 0 refills | Status: AC
Start: 2024-01-12 — End: 2024-01-12

## 2024-01-12 MED ORDER — NYSTATIN 100,000 UNIT/ML ORAL SUSPENSION
5.0000 mL | Freq: Four times a day (QID) | ORAL | 1 refills | Status: AC
Start: 2024-01-12 — End: 2024-01-24

## 2024-01-14 ENCOUNTER — Other Ambulatory Visit (INDEPENDENT_AMBULATORY_CARE_PROVIDER_SITE_OTHER): Payer: Self-pay

## 2024-02-05 ENCOUNTER — Encounter (HOSPITAL_BASED_OUTPATIENT_CLINIC_OR_DEPARTMENT_OTHER): Payer: Self-pay

## 2024-02-07 ENCOUNTER — Other Ambulatory Visit: Payer: Self-pay

## 2024-02-07 ENCOUNTER — Ambulatory Visit
Admission: RE | Admit: 2024-02-07 | Discharge: 2024-02-07 | Disposition: A | Discharge status: Home / Self Care | Source: Ambulatory Visit | Attending: Orthopaedic Surgery

## 2024-02-07 DIAGNOSIS — Z96651 Presence of right artificial knee joint: Secondary | ICD-10-CM | POA: Insufficient documentation

## 2024-02-07 DIAGNOSIS — T8484XA Pain due to internal orthopedic prosthetic devices, implants and grafts, initial encounter: Secondary | ICD-10-CM | POA: Insufficient documentation

## 2024-02-09 ENCOUNTER — Encounter (HOSPITAL_BASED_OUTPATIENT_CLINIC_OR_DEPARTMENT_OTHER): Payer: Self-pay | Admitting: Student in an Organized Health Care Education/Training Program

## 2024-02-09 DIAGNOSIS — M25461 Effusion, right knee: Secondary | ICD-10-CM

## 2024-02-10 ENCOUNTER — Other Ambulatory Visit: Payer: Self-pay

## 2024-02-10 ENCOUNTER — Ambulatory Visit: Attending: Orthopaedic Surgery | Admitting: Orthopaedic Surgery

## 2024-02-10 ENCOUNTER — Encounter (HOSPITAL_BASED_OUTPATIENT_CLINIC_OR_DEPARTMENT_OTHER): Payer: Self-pay | Admitting: Student in an Organized Health Care Education/Training Program

## 2024-02-10 DIAGNOSIS — Z96651 Presence of right artificial knee joint: Secondary | ICD-10-CM

## 2024-02-10 DIAGNOSIS — T8484XD Pain due to internal orthopedic prosthetic devices, implants and grafts, subsequent encounter: Secondary | ICD-10-CM

## 2024-02-10 NOTE — Progress Notes (Signed)
 JOINT REPLACEMENT, [place] [facility]  [address]  [place] [address]  Operated by [facility]  History and Physical    Name: [name] MRN:  [id number]   Date: 02/10/2024 DOB:  Oct 10, 1950 (73 y.o.)               No chief complaint on file.       Subjective:  Phone visit performed to discuss MRI results.  Painful R TKA performed 13 months ago. Pain is isolated to lateral right knee and accompanied by frequent episodes of swelling.   Recent R knee MRI performed to evaluate source of pain/swelling    Review of systems:  The remainder of the review of systems is negative    Imaging: Recent MRI of the right knee reveals a well fixed and well aligned total knee replacement with lateral overhang and possible tenting of lateral collateral ligament. Cyst or bursitis of IT band is visualized as well.    Impression:    ICD-10-CM    1. 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 Fernande is suspicious that her symptoms are related to lateral overhang of femoral component which appears to be oversized.  Explained that her options involve continuing to manage symptoms conservatively, or to undergo revision total knee replacement.   I discussed with patient that revision would require removal of both femoral and tibial components, which is a large undertaking and carries risks such as infection.  She will contemplate results of MRI with her family and return for follow up to discuss her options.  Patient is caring for husband who was recently diagnosed with cancer and is taking time to travel to his appointments.   Asked that she message via MyChart to schedule follow up at her convenience.  she will follow up prn  Radiographs needed at next visit:  Updated knee series annually  she was encouraged to call or e-mail with questions or concerns    This patient was seen independently in  clinic.  Rankin Perone, PA-C  02/10/2024, 16:34        Cc  [name], PA-C  [address]  [place] [address]    Joesph Kocher, DO  [address]  [place],  [place] [address]

## 2024-02-16 ENCOUNTER — Ambulatory Visit (HOSPITAL_BASED_OUTPATIENT_CLINIC_OR_DEPARTMENT_OTHER): Payer: Self-pay

## 2024-02-17 ENCOUNTER — Ambulatory Visit (HOSPITAL_BASED_OUTPATIENT_CLINIC_OR_DEPARTMENT_OTHER): Payer: Self-pay | Admitting: Orthopaedic Surgery

## 2024-02-25 ENCOUNTER — Encounter (INDEPENDENT_AMBULATORY_CARE_PROVIDER_SITE_OTHER): Payer: Self-pay | Admitting: Internal Medicine

## 2024-02-25 ENCOUNTER — Ambulatory Visit: Payer: Medicare Other | Admitting: Internal Medicine

## 2024-02-25 ENCOUNTER — Other Ambulatory Visit: Payer: Self-pay

## 2024-02-25 DIAGNOSIS — Z0000 Encounter for general adult medical examination without abnormal findings: Secondary | ICD-10-CM

## 2024-02-25 NOTE — Progress Notes (Signed)
 INTERNAL MEDICINE, [facility]ILD[facility]  [address]  [place] [address]  Operated by [facility]  Medicare Annual Wellness Visit    Name: [name] MRN:  [id number]   Date: 02/25/2024 Age: 73 y.o.       SUBJECTIVE:   [name] is a 73 y.o. female for presenting for Medicare Wellness exam.   I have reviewed and reconciled the medication list with the patient today.        02/25/2024     4:25 PM 02/24/2023    10:09 AM 02/18/2022     9:15 AM   Comprehensive Health Assessment-Adult   Do you wish to complete this form? Yes  Yes   During the past 4 weeks, how would you rate your health in general? Excellent Very Good Excellent   During the past 4 weeks, how much difficulty have you had doing your usual activities inside and outside your home because of medical or emotional problems? No difficulty at all A little bit of difficulty No difficulty at all   During the past 4 weeks, was someone available to help you if you needed and wanted help? Yes, quite a bit Yes, as much as I wanted Yes, as much as I wanted   In the past year, how many times have you gone to the emergency department or been admitted to a hospital for a health problem? None 1 time None   Are you generally satisfied with your sleep? Yes Yes Yes   Do you have enough money to buy things you need in everyday life, such as food, clothing, medicines, and housing? Yes, always Yes, always Yes, always   Can you get to places beyond walking distance without help?  (For example, can you drive your own car or travel alone on buses)? Yes Yes Yes   Do you fasten your seatbelt when you are in a car? Yes, usually Yes, usually Yes, usually   Do you exercise 20 minutes 3 or more days per week (such as walking, dancing, biking, mowing grass, swimming)? Yes, most of the time Yes, most of the time Yes, most of the time   How often do you eat food that is healthy (fruits, vegetables, lean meats) instead of unhealthy (sweets, fast food, junk  food, fatty foods)? Almost always Some of the time Most of the time   Have your parents, brothers or sisters had any of the following problems before the age of 1? (check all that apply)   Mental health problems such as depression, bipolar, severe anxiety, postpartum depression;Diabetes (sugar);Alcohol  or drug addiction (or abuse);Cancer;High cholesterol   How often do you have trouble taking medicines the eay you are told to take them? I always take them as prescribed I always take them as prescribed I always take them as prescribed   Do you need any help communicating with your doctors and nurses because of vision or hearing problems? No No No   During the past 12 months, have you experienced confusion or memory loss that is happening more often or is getting worse? No No No   Do you have one person you think of as your personal doctor (primary care provider or family doctor)? Yes Yes Yes   If you are seeing a Primary Care Provider (PCP) or family doctor. please list their name suzen Arieh Bogue Alanie Syler Taino Maertens   Are you now also seeing any specialist physician(s) (such as eye doctor, foot doctor, skin doctor)? Yes Yes  Yes   If you are seeing a specialist for anything such as foot, eye, skin, etc.  please list their name(s) dr morgan dr cline dr morgan     dr taylor   dr godwin dr morgan ((greg southers)   How confident are you that you can control or manage most of your health problems? Very confident Very confident Very confident       I have reviewed and updated as appropriate the past medical, family and social history. 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    Chronic idiopathic constipation     Constipation     Edema     Esophageal reflux     Hiatal hernia     IBS (irritable colon syndrome)     Osteoarthritis     Wears  glasses      Past Surgical History:   Procedure Laterality Date    Colonoscopy N/A     Facelift      Hx dilation and curettage N/A     Hx hip replacement Right     Hx hysterectomy      Hx knee replacment Right     Hx upper endoscopy N/A 08/2023     Current Outpatient Medications   Medication Sig    albuterol  sulfate (PROVENTIL  OR VENTOLIN  OR PROAIR ) 90 mcg/actuation Inhalation oral inhaler INHALE TWO PUFFS BY MOUTH FOUR TIMES DAILY    bethanechol  chloride (URECHOLINE ) 50 mg Oral Tablet Take 1 Tablet (50 mg total) by mouth Three times a day    docusate sodium  (COLACE) 100 mg Oral Capsule Take 1 Capsule (100 mg total) by mouth Every evening    estradioL  (ESTRACE ) 2 mg Oral Tablet TAKE ONE TABLET BY MOUTH ONCE EVERY DAY    famotidine  (PEPCID ) 40 mg Oral Tablet Take 1 Tablet (40 mg total) by mouth Every evening    furosemide  (LASIX ) 20 mg Oral Tablet Take 1 Tablet (20 mg total) by mouth Once per day as needed    Ibuprofen  (MOTRIN ) 800 mg Oral Tablet Take 1 Tablet (800 mg total) by mouth Every 8 hours as needed for Pain    Lactobacillus acidophilus (PROBIOTIC ACIDOPHILUS ORAL) Take 1 Capsule by mouth Once a day    lactulose  (ENULOSE ) 10 gram/15 mL Oral Solution Take 30 mL by mouth Daily    linaCLOtide  (LINZESS ) 290 mcg Oral Capsule Take 1 Capsule (290 mcg total) by mouth Every morning    omeprazole  (PRILOSEC) 40 mg Oral Capsule, Delayed Release(E.C.) Take 1 Capsule (40 mg total) by mouth Twice daily    ondansetron  (ZOFRAN  ODT) 4 mg Oral Tablet, Rapid Dissolve Take 1 Tablet (4 mg total) by mouth Every 8 hours as needed for Nausea/Vomiting     Family Medical History:       Problem Relation (Age of Onset)    Congestive Heart Failure Mother    Diabetes Sister    Uterine Cancer Sister            Social History     Socioeconomic History    Marital status: Married   Tobacco Use    Smoking status: Never    Smokeless tobacco: Never   Vaping Use    Vaping  status: Never Used   Substance and Sexual Activity    Alcohol  use: Not  Currently    Drug use: Never     Social Determinants of Health     Financial Resource Strain: Low Risk (05/31/2021)    Financial Resource Strain     SDOH Financial: No   Transportation Needs: Low Risk (05/31/2021)    Transportation Needs     SDOH Transportation: No   Social Connections: Low Risk (01/01/2023)    Social Connections     SDOH Social Isolation: 5 or more times a week   Intimate Partner Violence: Low Risk (05/31/2021)    Intimate Partner Violence     SDOH Domestic Violence: No   Housing Stability: Low Risk (05/31/2021)    Housing Stability     SDOH Housing Situation: I have housing.     SDOH Housing Worry: No   Health Literacy: Low Risk (02/25/2024)    Health Literacy     SDOH Health Literacy: Never   Employment Status: Low Risk (05/31/2021)    Employment Status     SDOH Employment: Otherwise unemployed but not seeking work (ex. consulting civil engineer, retired, disabled, unpaid primary care giver)         List of Current Health Care Providers   Care Team       PCP       Name Type Specialty Phone Number    Paticia Suzen LABOR, PA-C Physician Assistant EXTERNAL [phone]              Care Team       No care team found                      Health Maintenance   Topic Date Due    Tetanus-Diptheria Vaccines (1 - Tdap) Never done    Medicare Annual Wellness Visit - Calendar Year Insurers  03/26/2023    Breast Cancer Screening  06/08/2024    Covid-19 Vaccine (Shared decision making) (10 - 2025-26 season) 07/28/2024    Depression Screening  02/24/2025    Osteoporosis screening  05/11/2025    EGD Follow Up  11/20/2025    Colonoscopy  04/12/2027    Influenza Vaccine  Completed    Shingles Vaccine  Completed    RSV Adult 60+ or Pregnancy  Completed    Pneumococcal Vaccination, Age 64+  Completed    Hepatitis C screening  Discontinued     Medicare Wellness Assessment   Medicare initial or wellness physical in the last year?: No  Advance Directives   Does patient have a living will or MPOA: Yes   Has patient provided [employer] with a  copy?: Yes              Activities of Daily Living   Do you need help with dressing, bathing, or walking?: No   Do you need help with shopping, housekeeping, medications, or finances?: No   Do you have rugs in hallways, broken steps, or poor lighting?: No   Do you have grab bars in your bathroom, non-slip strips in your tub, and hand rails on your stairs?: Yes   Cognitive Function Screen (1=Yes, 0=No)   What is you age?: Correct   What is the time to the nearest hour?: Correct   What is the year?: Correct   What is the name of this clinic?: Correct   Can the patient recognize two persons (the doctor, the nurse, home help, etc.)?: Correct   What is  the date of your birth? (day and month sufficient) : Correct   In what year did World War II end?: Correct   Who is the current president of the [place] ?: Correct   Count from 20 down to 1?: Correct   What address did I give you earlier?: Correct   Total Score: 10       Fall Risk Screen   Do you feel unsteady when standing or walking?: No  Do you worry about falling?: No  Have you fallen in the past year?: No   Depression Screen     Little interest or pleasure in doing things.: Not at all  Feeling down, depressed, or hopeless: Not at all  PHQ 2 Total: 0     Pain Score   Pain Score:   0 - No pain    Substance Use-Abuse Screening     Tobacco Use     In Past 12 MONTHS, how often have you used any tobacco product (for example, cigarettes, e-cigarettes, cigars, pipes, or smokeless tobacco)?: Never     Alcohol  use     In the PAST 12 MONTHS, how often have you had 5 (men)/4 (women) or more drinks containing alcohol  in one day?: Never     Prescription Drug Use     In the PAST 12 months, how often have you used any prescription medications just for the feeling, more than prescribed, or that were not prescribed for you? Prescriptions may include: opioids, benzodiazepines, medications for ADHD: Never           Illicit Drug Use   In the PAST 12 MONTHS, how often have you used  any drugs, including marijuana, cocaine or crack, heroin, methamphetamine, hallucinogens, ecstasy/MDMA?: Never            Urine Incontinence Screen   Urinary Incontinence Screen  Do you ever leak urine when you don't want to?: No     OBJECTIVE:     Other appropriate exam:  Exam  Const  General: cooperative, no acute distress and alert  Resp  Effort & Inspection: able to speak in complete sentences  Psych  Mental Status: mental status grossly normal  Mood: congruent mood  Affect: normal affect  Attitude: cooperative  Insight: insight good  Judgment: judgment good   Health Maintenance Due   Topic Date Due    Tetanus-Diptheria Vaccines (1 - Tdap) Never done    [facility] Annual Wellness Visit - Calendar Year Insurers  03/26/2023      ASSESSMENT & PLAN:   Assessment/Plan   1. Medicare annual wellness visit, subsequent       Identified Risk Factors/ Recommended Actions       The PHQ 2 Total: 0 depression screen is interpreted as negative.       The patient has been educated about risk factors and recommended preventive care. Written Prevention Plan completed/ updated and given to patient (see After Visit Summary).    Meds reviewed as well as labs.  Chart reviewed and updated.   Continue current treatment.  Keep follow-up appointment.   Vaccine hx reviewed.           On the day of the encounter, a total of  20 minutes was spent on this patient encounter including review of historical information on phone with patient        [name], PA-C

## 2024-02-25 NOTE — Nursing Note (Signed)
 Medicare annual wellness

## 2024-02-25 NOTE — Patient Instructions (Signed)
 Medicare Preventive Services  Medicare coverage information Recommendation for YOU   Heart Disease and Diabetes   Lipid profile Every 5 years or more often if at risk for cardiovascular disease     Lab Results   Component Value Date    CHOLESTEROL 211 (H) 11/06/2023    HDLCHOL 101 11/06/2023    LDLCHOL 82 11/06/2023    TRIG 141 11/06/2023         Diabetes Screening    Yearly for those at risk for diabetes, 2 tests per year for those with prediabetes Last Glucose: 85    Diabetes Self Management Training or Medical Nutrition Therapy  For those with diabetes, up to 10 hrs initial training within a year, subsequent years up to 2 hrs of follow up training Optional for those with diabetes     Medical Nutrition Therapy  Three hours of one-on-one counseling in first year, two hours in subsequent years Optional for those with diabetes, kidney disease   Intensive Behavioral Therapy for Obesity  Face-to-face counseling, first month every week, month 2-6 every other week, month 7-12 every month if continued progress is documented Optional for those with Body Mass Index 30 or higher  Your There is no height or weight on file to calculate BMI.   Tobacco Cessation (Quitting) Counseling   Covers up to 8 smoking and tobacco-use cessation counseling sessions in a 16-month period.    Optional for those that use tobacco   Cancer Screening Last Completion Date   Colorectal screening   For anyone age 69 to 12 or any age if high risk:  Screening Colonoscopy every 10 yrs if low risk,  more frequent if higher risk  OR  Cologuard Stool DNA test once every 3 years OR  Fecal Occult Blood Testing yearly OR  Flexible  Sigmoidoscopy  every 5 yr OR  CT Colonography every 5 yrs    --04/11/2022  See below for due date if applicable.   Screening Pap Test   Recommended every 3 years for all women age 66 to 34, or every five years if combined with HPV test (routine screening not needed after total hysterectomy).  Medicare covers every 2 years or yearly  if high risk.  Screening Pelvic Exam   Medicare covers every 2 years, yearly if high risk or childbearing age with abnormal Pap in last 3 yrs.     See below for due date if applicable.   Screening Mammogram   Recommended every 2 years for women age 47 to 69, or more frequent if you have a higher risk. Selectively recommended for women between 40-49 based on shared decisions about risk. Covered by Medicare up to every year for women age 43 or older --06/09/2023  See below for due date if applicable.         Lung Cancer Screening  Annual low dose computed tomography (LDCT scan) is recommended for those age 66-80 who smoked 20 pack-years and are current smokers or quit smoking within past 15 years, after counseling by your doctor or nurse clinician about the possible benefits or harms.     See below for due date if applicable.   Vaccinations   Respiratory syncytial virus (RSV)  Age 27 years or older: Based on shared clinical decision-making with your provider.  Pneumococcal Vaccine  Recommended routinely age 44+ with one or two separate vaccines based on your risk. Recommended before age 28 if medical conditions with increased risk  Seasonal Influenza Vaccine  Once every flu  season   Hepatitis B Vaccine  3 doses if risk (including anyone with diabetes or liver disease)  Shingles Vaccine  Two doses at age 75 or older  Diphtheria Tetanus Pertussis Vaccine  ONCE as adult, booster every 10 years     Immunization History   Administered Date(s) Administered   . COVID-19 VACCINE,MODERNA(SPIKEVAX),50MCG/0.5ML,12 YR+,SEASONAL 01/21/2024   . Covid-19 Vaccine,Pfizer Bivalent,49mcg/0.3ml,12 yrs+ 01/10/2021, 01/17/2022   . Covid-19 Vaccine,Pfizer(Comirnaty),59mcg/0.3ml,12 yr+,Seasonal 12/18/2022, 01/29/2024   . Covid-19 Vaccine,Pfizer-BioNTech,Purple Top,75yrs+ 04/24/2019, 05/13/2019, 06/28/2020   . H1n1 Flu Vaccine Injection (Admin) 02/03/2008   . High-Dose Influenza Vaccine, 65+ (FLUZONE HD) 12/30/2020, 12/12/2021, 12/18/2022   .  Influenza Vaccine, 65+ (FLUAD) 01/07/2024   . PREVNAR 13 04/22/2017, 04/22/2017, 09/23/2017     Shingles vaccine and Diphtheria Tetanus Pertussis vaccines are available at pharmacies or local health department without a prescription.   Other Preventative Screening  Last Completion Date   Bone Densitometry   Screening: All females ages 105 and older every 10 years if initial screening normal. Postmenopausal women ages 40-64 need screening with one or more risk factor: previous fracture, parental hip fracture, current smoker, low body weight, excessive alcohol  use, Rheumatoid Arthritis   For women with diagnosed Osteoporosis, follow up is recommended every 2 years or a frequency recommended by your provider.     --05/12/2023  See below for due date if applicable.     Glaucoma Screening   Yearly if in high risk group such as diabetes, family history, African American age 35+ or Hispanic American age 50+   See your eye care provider for screening.   Hepatitis C Screening   Recommended  for those born between ages 18-79 years.     See below for due date if applicable.     HIV Testing  Recommended routinely at least ONCE, covered every year for age 18 to 37 regardless of risk, and every year for age over 61 who ask for the test or higher risk. Yearly or up to 3 times in pregnancy         See below for due date if applicable.   Abdominal Aortic Aneurysm Screening Ultrasound   Once with a family history of abdominal aortic aneurysms OR a female between65-75 and have smoked at least 100 cigarettes in your lifetime.         See below for due date if applicable.       Your Personalized Schedule for Preventive Tests   Health Maintenance: Pending and Last Completed       Date Due Completion Date    Tetanus-Diptheria Vaccines (1 - Tdap) Never done ---    Medicare Annual Wellness Visit - Calendar Year Insurers 03/26/2023 02/24/2023    Override on 02/18/2022: Done    Breast Cancer Screening 06/08/2024 06/09/2023    Covid-19 Vaccine  (Shared decision making) (10 - 2025-26 season) 07/28/2024 01/29/2024    Depression Screening 02/24/2025 02/25/2024    Osteoporosis screening 05/11/2025 05/12/2023    Override on 02/13/2021: Previously completed    EGD Follow Up 11/20/2025 11/21/2023    Colonoscopy 04/12/2027 04/11/2022                For Information on Advanced Directives for Health Care:  [place]:  [email or web address]  [place], [place], [place], [place] General Information: [email or web address]

## 2024-03-02 ENCOUNTER — Other Ambulatory Visit (INDEPENDENT_AMBULATORY_CARE_PROVIDER_SITE_OTHER): Payer: Self-pay | Admitting: Internal Medicine

## 2024-03-24 ENCOUNTER — Other Ambulatory Visit (INDEPENDENT_AMBULATORY_CARE_PROVIDER_SITE_OTHER): Payer: Self-pay | Admitting: Internal Medicine

## 2024-04-20 ENCOUNTER — Other Ambulatory Visit (INDEPENDENT_AMBULATORY_CARE_PROVIDER_SITE_OTHER): Payer: Self-pay | Admitting: Internal Medicine

## 2024-04-20 MED ORDER — AMOXICILLIN 500 MG CAPSULE: 500.0000 mg | ORAL_CAPSULE | Freq: Three times a day (TID) | ORAL | 0 refills | Status: AC

## 2024-04-27 ENCOUNTER — Other Ambulatory Visit (INDEPENDENT_AMBULATORY_CARE_PROVIDER_SITE_OTHER): Payer: Self-pay | Admitting: Internal Medicine

## 2024-04-27 MED ORDER — OMEPRAZOLE 40 MG CAPSULE,DELAYED RELEASE: 40.0000 mg | DELAYED_RELEASE_CAPSULE | Freq: Two times a day (BID) | ORAL | 2 refills | Status: AC

## 2024-08-26 ENCOUNTER — Ambulatory Visit (INDEPENDENT_AMBULATORY_CARE_PROVIDER_SITE_OTHER): Payer: Self-pay | Admitting: Internal Medicine

## 2025-03-03 ENCOUNTER — Ambulatory Visit (INDEPENDENT_AMBULATORY_CARE_PROVIDER_SITE_OTHER): Payer: Self-pay | Admitting: Internal Medicine
